# Patient Record
Sex: Male | Born: 1970 | Race: White | Hispanic: No | Marital: Married | Smoking: Never smoker
Health system: Southern US, Community
[De-identification: ages and names within clinical notes are randomized; demographics above are authoritative.]

## PROBLEM LIST (undated history)

## (undated) DIAGNOSIS — I10 Essential (primary) hypertension: Secondary | ICD-10-CM

## (undated) DIAGNOSIS — K219 Gastro-esophageal reflux disease without esophagitis: Secondary | ICD-10-CM

## (undated) DIAGNOSIS — J45909 Unspecified asthma, uncomplicated: Secondary | ICD-10-CM

## (undated) HISTORY — DX: Gastro-esophageal reflux disease without esophagitis: K21.9

## (undated) HISTORY — DX: Unspecified asthma, uncomplicated: J45.909

## (undated) HISTORY — PX: OTHER SURGICAL HISTORY: SHX169

---

## 2004-02-25 HISTORY — PX: KNEE ARTHROSCOPY: SUR90

## 2006-03-11 ENCOUNTER — Ambulatory Visit: Payer: Self-pay | Admitting: Sports Medicine

## 2006-03-24 ENCOUNTER — Ambulatory Visit: Payer: Self-pay | Admitting: Sports Medicine

## 2007-07-07 ENCOUNTER — Encounter: Payer: Self-pay | Admitting: Pulmonary Disease

## 2007-07-07 ENCOUNTER — Ambulatory Visit (HOSPITAL_COMMUNITY): Admission: RE | Admit: 2007-07-07 | Discharge: 2007-07-07 | Payer: Self-pay | Admitting: Internal Medicine

## 2007-08-04 ENCOUNTER — Ambulatory Visit: Payer: Self-pay | Admitting: Pulmonary Disease

## 2007-08-04 DIAGNOSIS — J45909 Unspecified asthma, uncomplicated: Secondary | ICD-10-CM | POA: Insufficient documentation

## 2007-08-04 DIAGNOSIS — I10 Essential (primary) hypertension: Secondary | ICD-10-CM | POA: Insufficient documentation

## 2007-08-04 DIAGNOSIS — J45990 Exercise induced bronchospasm: Secondary | ICD-10-CM | POA: Insufficient documentation

## 2007-08-26 ENCOUNTER — Ambulatory Visit: Payer: Self-pay | Admitting: Pulmonary Disease

## 2007-11-08 ENCOUNTER — Ambulatory Visit: Payer: Self-pay | Admitting: Pulmonary Disease

## 2011-04-04 ENCOUNTER — Ambulatory Visit: Payer: BC Managed Care – PPO | Admitting: Family Medicine

## 2011-04-04 VITALS — BP 129/82 | HR 63 | Temp 97.8°F | Resp 16 | Ht 75.5 in | Wt 215.0 lb

## 2011-04-04 DIAGNOSIS — J0110 Acute frontal sinusitis, unspecified: Secondary | ICD-10-CM

## 2011-04-04 DIAGNOSIS — J019 Acute sinusitis, unspecified: Secondary | ICD-10-CM

## 2011-04-04 DIAGNOSIS — R42 Dizziness and giddiness: Secondary | ICD-10-CM

## 2011-04-04 MED ORDER — MECLIZINE HCL 32 MG PO TABS: 32.0000 mg | ORAL_TABLET | Freq: Three times a day (TID) | ORAL | Status: AC | PRN

## 2011-04-04 MED ORDER — AMOXICILLIN 875 MG PO TABS: 875.0000 mg | ORAL_TABLET | Freq: Two times a day (BID) | ORAL | Status: AC

## 2011-04-04 NOTE — Progress Notes (Signed)
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    O:  TM's normal Oropharynx-clear Chest-clear Neck supple without adenopathy Neuro:  No nystagmus, CN intact, moving 4 extrem normally.  A:  Vertigo, sinusitis  P:  Meclizine and Amoxicillin

## 2011-11-15 ENCOUNTER — Ambulatory Visit: Payer: BC Managed Care – PPO

## 2011-11-15 ENCOUNTER — Emergency Department (INDEPENDENT_AMBULATORY_CARE_PROVIDER_SITE_OTHER)
Admission: EM | Admit: 2011-11-15 | Discharge: 2011-11-15 | Disposition: A | Discharge status: Home / Self Care | Payer: BC Managed Care – PPO | Source: Home / Self Care

## 2011-11-15 DIAGNOSIS — S39012A Strain of muscle, fascia and tendon of lower back, initial encounter: Secondary | ICD-10-CM

## 2011-11-15 DIAGNOSIS — S339XXA Sprain of unspecified parts of lumbar spine and pelvis, initial encounter: Secondary | ICD-10-CM

## 2011-11-15 HISTORY — DX: Essential (primary) hypertension: I10

## 2011-11-15 MED ORDER — KETOROLAC TROMETHAMINE 60 MG/2ML IM SOLN
60.0000 mg | Freq: Once | INTRAMUSCULAR | Status: AC
  Administered 2011-11-15: 60 mg via INTRAMUSCULAR

## 2011-11-15 MED ORDER — CYCLOBENZAPRINE HCL 5 MG PO TABS: 5.0000 mg | ORAL_TABLET | Freq: Three times a day (TID) | ORAL | Status: DC | PRN

## 2011-11-15 MED ORDER — MELOXICAM 15 MG PO TABS: 15.0000 mg | ORAL_TABLET | Freq: Every day | ORAL | Status: DC

## 2011-11-15 NOTE — ED Provider Notes (Signed)
History     CSN: [id number]  Arrival date & time 11/15/11  1722   First MD Initiated Contact with Patient 11/15/11 1759      Chief Complaint  Patient presents with  . Back Pain   HPI Comments: Pt lifted 70-80lb jack earlier today.  2-3 hours later while at home, pt had sudden onset of sever low back pain that nearly dropped pt to knees. Initial pain was in lumbar region that radiated around the back. Pt states that pain felt like electrical shock.  No radiation down leg. No numbness or paresthesias.  Has had severe pain with ambulation since this point.  No bowel or bladder anesthesia    Patient is a 41 y.o. male presenting with back pain.  Back Pain  This is a new problem. Episode onset: earlier today  The problem has not changed since onset.The pain is associated with lifting heavy objects. The pain is present in the lumbar spine. The quality of the pain is described as aching and stabbing.    Past Medical History  Diagnosis Date  . Hypertension     Past Surgical History  Procedure Date  . Knee arthroscopy     History reviewed. No pertinent family history.  History  Substance Use Topics  . Smoking status: Never Smoker   . Smokeless tobacco: Not on file  . Alcohol Use: No      Review of Systems  Musculoskeletal: Positive for back pain.  All other systems reviewed and are negative.    Allergies  Review of patient's allergies indicates no known allergies.  Home Medications   Current Outpatient Rx  Name Route Sig Dispense Refill  . VITAMIN C 1000 MG PO TABS Oral Take 1,000 mg by mouth daily.    Marland Kitchen FINASTERIDE 5 MG PO TABS Oral Take 5 mg by mouth daily.    Marland Kitchen LISINOPRIL-HYDROCHLOROTHIAZIDE 10-12.5 MG PO TABS Oral Take 1 tablet by mouth daily.    Marland Kitchen PSEUDOEPHEDRINE-GUAIFENESIN ER 60-600 MG PO TB12 Oral Take 1 tablet by mouth every 12 (twelve) hours.      BP 115/82  Pulse 54  Temp 98.1 F (36.7 C) (Oral)  Resp 18  Ht 6\' 4"  (1.93 m)  Wt 210 lb (95.255 kg)   BMI 25.56 kg/m2  SpO2 97%  Physical Exam  Constitutional: He appears well-developed and well-nourished.  HENT:  Head: Normocephalic and atraumatic.  Eyes: Conjunctivae normal are normal. Pupils are equal, round, and reactive to light.  Neck: Normal range of motion. Neck supple.  Cardiovascular: Normal rate and regular rhythm.   Pulmonary/Chest: Effort normal and breath sounds normal.  Abdominal: Soft. Bowel sounds are normal.  Musculoskeletal:       Arms:      + TTP in lumbosacral region + marked pain with back flexion and extension + FABER with radiation of pain to L LS region     ED Course  Procedures (including critical care time)  Labs Reviewed - No data to display No results found.   1. Lumbosacral strain       MDM  Likely lumbosacral strain.  No radicular sxs currently.  Will treat with NSAIDs and flexeril.  Toradol 60mg  IM x1.  RICE tx.  Discussed general red flags for reevaluation.  Follow up as needed.      The patient and/or caregiver has been counseled thoroughly with regard to treatment plan and/or medications prescribed including dosage, schedule, interactions, rationale for use, and possible side effects and they verbalize understanding. Diagnoses  and expected course of recovery discussed and will return if not improved as expected or if the condition worsens. Patient and/or caregiver verbalized understanding.             Doree Albee, MD 11/15/11 [id number]

## 2011-11-15 NOTE — ED Notes (Signed)
Lower back pain started today w/out known injury. States he hda lower back pain with a 2-3 second electrical shock that radiated down leg

## 2012-01-07 ENCOUNTER — Encounter: Payer: Self-pay | Admitting: Physician Assistant

## 2012-06-15 ENCOUNTER — Telehealth: Payer: Self-pay

## 2012-06-15 MED ORDER — FINASTERIDE 5 MG PO TABS: 5.0000 mg | ORAL_TABLET | Freq: Every day | ORAL | Status: DC

## 2012-06-15 MED ORDER — LISINOPRIL-HYDROCHLOROTHIAZIDE 10-12.5 MG PO TABS: 1.0000 | ORAL_TABLET | Freq: Every day | ORAL | Status: DC

## 2012-06-15 NOTE — Telephone Encounter (Signed)
What meds does he need?

## 2012-06-15 NOTE — Telephone Encounter (Signed)
PT HAS SCHEDULED COMPLETE PE WITH [facility] FOR MAY 21.  HE NEEDS A REFILL ON HIS TWO SCRIPTS TO LAST HIM UNTIL THEN.  [phone]

## 2012-07-14 ENCOUNTER — Ambulatory Visit (INDEPENDENT_AMBULATORY_CARE_PROVIDER_SITE_OTHER): Payer: BC Managed Care – PPO | Admitting: Internal Medicine

## 2012-07-14 ENCOUNTER — Encounter: Payer: Self-pay | Admitting: Internal Medicine

## 2012-07-14 VITALS — BP 136/89 | HR 50 | Temp 97.0°F | Resp 16 | Ht 75.5 in | Wt 213.0 lb

## 2012-07-14 DIAGNOSIS — L649 Androgenic alopecia, unspecified: Secondary | ICD-10-CM

## 2012-07-14 DIAGNOSIS — Z0000 Encounter for general adult medical examination without abnormal findings: Secondary | ICD-10-CM

## 2012-07-14 DIAGNOSIS — I10 Essential (primary) hypertension: Secondary | ICD-10-CM

## 2012-07-14 LAB — CBC WITH DIFFERENTIAL/PLATELET
Basophils Absolute: 0 10*3/uL (ref 0.0–0.1)
Basophils Relative: 0 % (ref 0–1)
Eosinophils Absolute: 0.1 10*3/uL (ref 0.0–0.7)
Hemoglobin: 14 g/dL (ref 13.0–17.0)
MCH: 27.4 pg (ref 26.0–34.0)
MCHC: 32.8 g/dL (ref 30.0–36.0)
Monocytes Relative: 9 % (ref 3–12)
Neutro Abs: 3.5 10*3/uL (ref 1.7–7.7)
Neutrophils Relative %: 63 % (ref 43–77)
Platelets: 243 10*3/uL (ref 150–400)
RDW: 14 % (ref 11.5–15.5)

## 2012-07-14 LAB — POCT URINALYSIS DIPSTICK
Blood, UA: NEGATIVE
Glucose, UA: NEGATIVE
Leukocytes, UA: NEGATIVE
Nitrite, UA: NEGATIVE
Urobilinogen, UA: 0.2
pH, UA: 7

## 2012-07-14 MED ORDER — FINASTERIDE 5 MG PO TABS: 5.0000 mg | ORAL_TABLET | Freq: Every day | ORAL | Status: DC

## 2012-07-14 MED ORDER — LISINOPRIL 10 MG PO TABS: 10.0000 mg | ORAL_TABLET | Freq: Every day | ORAL | Status: DC

## 2012-07-14 NOTE — Progress Notes (Signed)
  Subjective:    Patient ID: Zachary Diaz, male    DOB: [birth year], 41 y.o.   MRN: [id number]  HPICPE Doing well 5x28oz daily h20--strict attention to hydration has led to resolution of gastroesophageal reflux Home blood pressures are all low Exercise tolerance is great  Current outpatient prescriptions:Ascorbic Acid (VITAMIN C) 1000 MG tablet, Take 1,000 mg by mouth daily., Disp: , Rfl: ;  finasteride (PROSCAR) 5 MG tablet, Take 1 tablet (5 mg total) by mouth daily., Disp: 30 tablet, Rfl: 0;  lisinopril-hydrochlorothiazide (PRINZIDE,ZESTORETIC) 10-12.5 MG per tablet, Take 1 tablet by mouth daily., Disp: 30 tablet, Rfl: 0  Married/blended fam/13yo son high func autism--others younger [employer] [place] Pneumovax 2012 [id number]  Review of Systems  Constitutional: Negative for activity change, appetite change, fatigue and unexpected weight change.  HENT: Negative for congestion, rhinorrhea, trouble swallowing, neck pain and dental problem.   Eyes: Negative for photophobia.  Respiratory: Negative for cough, shortness of breath and wheezing.        History of exercise-induced asthma now stable  Cardiovascular: Negative for chest pain and leg swelling.  Gastrointestinal: Negative for abdominal pain, diarrhea and constipation.  Endocrine: Negative for cold intolerance and heat intolerance.  Genitourinary: Negative for difficulty urinating.  Musculoskeletal: Negative for myalgias, joint swelling, arthralgias and gait problem.       Reason back pain again after a lifting injury resolving with self-directed physical therapy  Skin: Negative for rash.  Neurological: Negative for dizziness and headaches.  Hematological: Negative for adenopathy. Does not bruise/bleed easily.  Psychiatric/Behavioral: Negative for sleep disturbance and dysphoric mood.       Objective:   Physical Exam BP 136/89  Pulse 50  Temp(Src) 97 F (36.1 C)  Resp 16  Ht 6' 3.5" (1.918 m)  Wt 213 lb (96.616 kg)   BMI 26.26 kg/m2 No acute distress Skin clear except early male pattern baldness HEENT clear Heart regular without murmur Lungs clear Abdomen supple without organomegaly Extremities with good pulses and no edema Spine straight Straight leg raise negative Cranial nerves II through XII Romberg negative No sensory and motor losses Mood good/affect appropriate       Assessment & Plan:  Annual physical  Hypertension  Male pattern baldness  History of exercise-induced asthma Will we'll discontinue diuretic portion of blood pressure pill and continue lisinopril 10 mg/he will follow  Meds ordered this encounter  Medications  . lisinopril (PRINIVIL,ZESTRIL) 10 MG tablet    Sig: Take 1 tablet (10 mg total) by mouth daily.    Dispense:  90 tablet    Refill:  3  . finasteride (PROSCAR) 5 MG tablet    Sig: Take 1 tablet (5 mg total) by mouth daily.    Dispense:  90 tablet    Refill:  3    home blood pressures and if above 130/80 consistently we will go up on his medication

## 2012-07-15 LAB — COMPREHENSIVE METABOLIC PANEL
AST: 13 U/L (ref 0–37)
Albumin: 4.3 g/dL (ref 3.5–5.2)
Alkaline Phosphatase: 41 U/L (ref 39–117)
Glucose, Bld: 78 mg/dL (ref 70–99)
Potassium: 4.3 mEq/L (ref 3.5–5.3)
Sodium: 138 mEq/L (ref 135–145)
Total Bilirubin: 0.6 mg/dL (ref 0.3–1.2)
Total Protein: 6.3 g/dL (ref 6.0–8.3)

## 2012-07-15 LAB — LIPID PANEL
HDL: 50 mg/dL (ref >39)
LDL Cholesterol: 104 mg/dL — ABNORMAL HIGH (ref 0–99)
Triglycerides: 51 mg/dL (ref <150)
VLDL: 10 mg/dL (ref 0–40)

## 2012-07-16 ENCOUNTER — Encounter: Payer: Self-pay | Admitting: Internal Medicine

## 2012-11-30 ENCOUNTER — Other Ambulatory Visit: Payer: Self-pay | Admitting: Internal Medicine

## 2012-12-07 ENCOUNTER — Other Ambulatory Visit: Payer: Self-pay | Admitting: Physician Assistant

## 2012-12-15 ENCOUNTER — Encounter: Payer: Self-pay | Admitting: *Deleted

## 2013-07-18 ENCOUNTER — Other Ambulatory Visit: Payer: Self-pay | Admitting: Internal Medicine

## 2013-07-27 ENCOUNTER — Ambulatory Visit (INDEPENDENT_AMBULATORY_CARE_PROVIDER_SITE_OTHER): Payer: BC Managed Care – PPO | Admitting: Internal Medicine

## 2013-07-27 ENCOUNTER — Ambulatory Visit: Payer: BC Managed Care – PPO

## 2013-07-27 ENCOUNTER — Encounter: Payer: Self-pay | Admitting: Internal Medicine

## 2013-07-27 VITALS — BP 117/74 | HR 45 | Temp 98.5°F | Resp 16 | Ht 75.0 in | Wt 214.0 lb

## 2013-07-27 DIAGNOSIS — M545 Low back pain, unspecified: Secondary | ICD-10-CM

## 2013-07-27 DIAGNOSIS — L819 Disorder of pigmentation, unspecified: Secondary | ICD-10-CM

## 2013-07-27 DIAGNOSIS — Z0000 Encounter for general adult medical examination without abnormal findings: Secondary | ICD-10-CM

## 2013-07-27 DIAGNOSIS — L989 Disorder of the skin and subcutaneous tissue, unspecified: Secondary | ICD-10-CM

## 2013-07-27 LAB — LIPID PANEL
Cholesterol: 165 mg/dL (ref 0–200)
HDL: 63 mg/dL (ref >39)
LDL CALC: 94 mg/dL (ref 0–99)
TRIGLYCERIDES: 42 mg/dL (ref <150)
Total CHOL/HDL Ratio: 2.6 Ratio
VLDL: 8 mg/dL (ref 0–40)

## 2013-07-27 LAB — POCT URINALYSIS DIPSTICK
BILIRUBIN UA: NEGATIVE
Blood, UA: NEGATIVE
GLUCOSE UA: NEGATIVE
KETONES UA: NEGATIVE
LEUKOCYTES UA: NEGATIVE
Nitrite, UA: NEGATIVE
PH UA: 7.5
Protein, UA: NEGATIVE
Spec Grav, UA: 1.01
Urobilinogen, UA: 0.2

## 2013-07-27 LAB — CBC WITH DIFFERENTIAL/PLATELET
Basophils Absolute: 0 10*3/uL (ref 0.0–0.1)
Basophils Relative: 0 % (ref 0–1)
Eosinophils Absolute: 0.1 10*3/uL (ref 0.0–0.7)
Eosinophils Relative: 1 % (ref 0–5)
HEMATOCRIT: 42.8 % (ref 39.0–52.0)
HEMOGLOBIN: 14.4 g/dL (ref 13.0–17.0)
LYMPHS ABS: 1.5 10*3/uL (ref 0.7–4.0)
LYMPHS PCT: 25 % (ref 12–46)
MCH: 27.8 pg (ref 26.0–34.0)
MCHC: 33.6 g/dL (ref 30.0–36.0)
MCV: 82.6 fL (ref 78.0–100.0)
MONO ABS: 0.5 10*3/uL (ref 0.1–1.0)
MONOS PCT: 8 % (ref 3–12)
NEUTROS ABS: 4 10*3/uL (ref 1.7–7.7)
NEUTROS PCT: 66 % (ref 43–77)
Platelets: 216 10*3/uL (ref 150–400)
RBC: 5.18 MIL/uL (ref 4.22–5.81)
RDW: 13.6 % (ref 11.5–15.5)
WBC: 6 10*3/uL (ref 4.0–10.5)

## 2013-07-27 LAB — COMPREHENSIVE METABOLIC PANEL
ALBUMIN: 4.5 g/dL (ref 3.5–5.2)
ALT: 10 U/L (ref 0–53)
AST: 14 U/L (ref 0–37)
Alkaline Phosphatase: 37 U/L — ABNORMAL LOW (ref 39–117)
BUN: 12 mg/dL (ref 6–23)
CALCIUM: 9.6 mg/dL (ref 8.4–10.5)
CHLORIDE: 103 meq/L (ref 96–112)
CO2: 29 mEq/L (ref 19–32)
Creat: 1.02 mg/dL (ref 0.50–1.35)
GLUCOSE: 84 mg/dL (ref 70–99)
POTASSIUM: 4.9 meq/L (ref 3.5–5.3)
SODIUM: 139 meq/L (ref 135–145)
TOTAL PROTEIN: 7 g/dL (ref 6.0–8.3)
Total Bilirubin: 0.8 mg/dL (ref 0.2–1.2)

## 2013-07-27 MED ORDER — LISINOPRIL 5 MG PO TABS: 5.0000 mg | ORAL_TABLET | Freq: Every day | ORAL | Status: DC

## 2013-07-27 MED ORDER — FINASTERIDE 5 MG PO TABS: ORAL_TABLET | ORAL | Status: DC

## 2013-07-27 NOTE — Progress Notes (Signed)
   Subjective:    Patient ID: Zachary Diaz, male    DOB: 08/02/1970, 43 y.o.   MRN: [id number]  HPIcpe Patient Active Problem List   Diagnosis Date Noted  . 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 HYPERTENSION 08/04/2007  . EXERCISE INDUCED ASTHMA 08/04/2007  . 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  Skin: Positive for color change.  On face has 2 yr hx irreg splotches during spring and summer rest of ros neg     Objective:   Physical Exam  Constitutional: He is oriented to person, place, and time. He appears well-developed and well-nourished.  HENT:  Head: Normocephalic.  Right Ear: External ear normal.  Left Ear: External ear normal.  Nose: Nose normal.  Mouth/Throat: Oropharynx is clear and moist.  Tms and canals clear  Eyes: Conjunctivae and EOM are normal. Pupils are equal, round, and reactive to light.  Neck: Normal range of motion. Neck supple. No thyromegaly present.  Cardiovascular: Normal rate, regular rhythm, normal heart sounds and intact distal pulses.   No murmur heard. Pulmonary/Chest: Effort normal and breath sounds normal. No respiratory distress. He has no wheezes. He has no rales.  Abdominal: Soft. Bowel sounds are normal. He exhibits no distension and no mass. There is no tenderness. There is no rebound and no guarding.  No hepatosplenomegaly  Musculoskeletal: Normal range of motion. He exhibits no edema and no tenderness.  Can't repro tend w/ palp but can produce pain at l4 region with extension. Post thighs are very tight. Flex to toes =pain as well  Lymphadenopathy:    He has no cervical adenopathy.  Neurological: He is  alert and oriented to person, place, and time. He has normal reflexes. No cranial nerve deficit. He exhibits normal muscle tone. Coordination normal.  Skin: Skin is warm and dry. No rash noted.  Symmetrical facial hyperpigmentation in splotches oddly sparing most shaved areas  Psychiatric: He has a normal mood and affect. His behavior is normal. Judgment and thought content normal.   [facility] reading (PRIMARY) by  Dr. Katherine Basset at L4-5 without spondylo changes      Assessment & Plan:  Routine general medical examination at a health care facility - Plan: POCT urinalysis dipstick, CBC with Differential, Comprehensive metabolic panel, Lipid panel  Lumbar pain - Plan: DG Lumbar Spine Complete, Ambulatory referral to Physical Therapy  Hyperpigmentation of skin of cheek - Plan: Ambulatory referral to Dermatology  Meds ordered this encounter  Medications  . lisinopril (PRINIVIL,ZESTRIL) 5 MG tablet    Sig: Take 1 tablet (5 mg total) by mouth daily.    Dispense:  90 tablet    Refill:  3  . finasteride (PROSCAR) 5 MG tablet    Sig: TAKE 1 TABLET (5 MG TOTAL) BY MOUTH DAILY.    Dispense:  90 tablet    Refill:  3

## 2013-08-01 ENCOUNTER — Encounter: Payer: Self-pay | Admitting: Internal Medicine

## 2013-09-24 ENCOUNTER — Other Ambulatory Visit: Payer: Self-pay | Admitting: Internal Medicine

## 2014-08-02 ENCOUNTER — Encounter: Payer: Self-pay | Admitting: Internal Medicine

## 2014-08-02 ENCOUNTER — Ambulatory Visit (INDEPENDENT_AMBULATORY_CARE_PROVIDER_SITE_OTHER): Payer: BLUE CROSS/BLUE SHIELD | Admitting: Internal Medicine

## 2014-08-02 VITALS — BP 136/90 | HR 38 | Temp 97.9°F | Resp 16 | Ht 76.5 in | Wt 217.6 lb

## 2014-08-02 DIAGNOSIS — Z0000 Encounter for general adult medical examination without abnormal findings: Secondary | ICD-10-CM | POA: Diagnosis not present

## 2014-08-02 DIAGNOSIS — I10 Essential (primary) hypertension: Secondary | ICD-10-CM | POA: Diagnosis not present

## 2014-08-02 LAB — POCT URINALYSIS DIPSTICK
Bilirubin, UA: NEGATIVE
GLUCOSE UA: NEGATIVE
KETONES UA: NEGATIVE
LEUKOCYTES UA: NEGATIVE
NITRITE UA: NEGATIVE
PH UA: 7
Protein, UA: NEGATIVE
RBC UA: NEGATIVE
SPEC GRAV UA: 1.01
UROBILINOGEN UA: 0.2

## 2014-08-02 MED ORDER — FINASTERIDE 5 MG PO TABS: ORAL_TABLET | ORAL | Status: DC

## 2014-08-02 MED ORDER — LISINOPRIL 5 MG PO TABS: 5.0000 mg | ORAL_TABLET | Freq: Every day | ORAL | Status: DC

## 2014-08-02 NOTE — Progress Notes (Signed)
   Subjective:    Patient ID: Zachary Diaz, male    DOB: 05/17/1970, 44 y.o.   MRN: [id number]  HPIannual Patient Active Problem List   Diagnosis Date Noted  . Male pattern baldness--- continues to be satisfied with medication  07/14/2012  . HYPERTENSION 08/04/2007  . 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   HENT: Negative.   Eyes: Negative.   Respiratory: Negative.   Cardiovascular: Negative.   Gastrointestinal: Negative.   Endocrine: Negative.   Genitourinary: Negative.   Musculoskeletal: Negative.   Skin: Negative.   Allergic/Immunologic: Negative.   Neurological: Negative.   Hematological: Negative.   Psychiatric/Behavioral: Negative.        Objective:   Physical Exam  Constitutional: He is oriented to person, place, and time. He appears well-developed and well-nourished.  HENT:  Head: Normocephalic and atraumatic.  Right Ear: Hearing, tympanic membrane, external ear and ear canal normal.  Left Ear: Hearing, tympanic membrane, external ear and ear canal normal.  Nose: Nose normal.  Mouth/Throat: Uvula is midline, oropharynx is clear and moist and mucous membranes are normal.  Eyes: Conjunctivae, EOM and lids are normal. Pupils are equal, round, and reactive to light. Right eye exhibits no discharge. Left eye exhibits no discharge. No scleral icterus.  Neck: Trachea normal and normal range of motion. Neck supple. Carotid bruit is not present.  Cardiovascular: Normal rate, regular rhythm, normal heart sounds, intact distal pulses and normal pulses.   No murmur heard. Pulmonary/Chest: Effort normal and breath sounds normal. No respiratory distress. He has no wheezes. He has no rhonchi. He has no rales.  Abdominal: Soft. Normal appearance and bowel sounds are normal. He exhibits  no abdominal bruit. There is no tenderness.  Musculoskeletal: Normal range of motion. He exhibits no edema or tenderness.  Lymphadenopathy:       Head (right side): No submental, no submandibular, no tonsillar, no preauricular, no posterior auricular and no occipital adenopathy present.       Head (left side): No submental, no submandibular, no tonsillar, no preauricular, no posterior auricular and no occipital adenopathy present.    He has no cervical adenopathy.  Neurological: He is alert and oriented to person, place, and time. He has normal strength and normal reflexes. No cranial nerve deficit or sensory deficit. Coordination and gait normal.  Skin: Skin is warm, dry and intact. No lesion and no rash noted.  Psychiatric: He has a normal mood and affect. His speech is normal and behavior is normal. Judgment and thought content normal.       Assessment & Plan:  Annual physical exam  Essential hypertension - Plan: POCT urinalysis dipstick, Comprehensive metabolic panel, CBC, Lipid panel  Male pattern baldness Meds ordered this encounter  Medications  . lisinopril (PRINIVIL,ZESTRIL) 5 MG tablet    Sig: Take 1 tablet (5 mg total) by mouth daily.    Dispense:  90 tablet    Refill:  3  . finasteride (PROSCAR) 5 MG tablet    Sig: TAKE 1 TABLET (5 MG TOTAL) BY MOUTH DAILY.    Dispense:  90 tablet    Refill:  3   Mail labs

## 2014-08-03 ENCOUNTER — Encounter: Payer: Self-pay | Admitting: Internal Medicine

## 2014-08-03 LAB — CBC
HEMATOCRIT: 44.6 % (ref 39.0–52.0)
Hemoglobin: 14.6 g/dL (ref 13.0–17.0)
MCH: 28 pg (ref 26.0–34.0)
MCHC: 32.7 g/dL (ref 30.0–36.0)
MCV: 85.4 fL (ref 78.0–100.0)
MPV: 10.1 fL (ref 8.6–12.4)
Platelets: 234 10*3/uL (ref 150–400)
RBC: 5.22 MIL/uL (ref 4.22–5.81)
RDW: 13.7 % (ref 11.5–15.5)
WBC: 5.4 10*3/uL (ref 4.0–10.5)

## 2014-08-03 LAB — COMPREHENSIVE METABOLIC PANEL
ALK PHOS: 36 U/L — AB (ref 39–117)
ALT: 14 U/L (ref 0–53)
AST: 16 U/L (ref 0–37)
Albumin: 4.6 g/dL (ref 3.5–5.2)
BILIRUBIN TOTAL: 0.9 mg/dL (ref 0.2–1.2)
BUN: 10 mg/dL (ref 6–23)
CALCIUM: 9.4 mg/dL (ref 8.4–10.5)
CHLORIDE: 101 meq/L (ref 96–112)
CO2: 25 meq/L (ref 19–32)
Creat: 0.86 mg/dL (ref 0.50–1.35)
Glucose, Bld: 72 mg/dL (ref 70–99)
Potassium: 4.8 mEq/L (ref 3.5–5.3)
SODIUM: 138 meq/L (ref 135–145)
Total Protein: 6.6 g/dL (ref 6.0–8.3)

## 2014-08-03 LAB — LIPID PANEL
CHOL/HDL RATIO: 3.3 ratio
Cholesterol: 174 mg/dL (ref 0–200)
HDL: 53 mg/dL (ref >=40)
LDL Cholesterol: 103 mg/dL — ABNORMAL HIGH (ref 0–99)
TRIGLYCERIDES: 89 mg/dL (ref <150)
VLDL: 18 mg/dL (ref 0–40)

## 2015-01-22 ENCOUNTER — Encounter: Payer: Self-pay | Admitting: Internal Medicine

## 2015-08-08 ENCOUNTER — Encounter: Payer: BLUE CROSS/BLUE SHIELD | Admitting: Internal Medicine

## 2015-08-13 ENCOUNTER — Encounter: Payer: BLUE CROSS/BLUE SHIELD | Admitting: Family Medicine

## 2015-08-15 ENCOUNTER — Encounter: Payer: BLUE CROSS/BLUE SHIELD | Admitting: Family Medicine

## 2015-08-16 ENCOUNTER — Encounter: Payer: BLUE CROSS/BLUE SHIELD | Admitting: Family Medicine

## 2015-08-30 ENCOUNTER — Telehealth: Payer: Self-pay | Admitting: *Deleted

## 2015-08-30 ENCOUNTER — Ambulatory Visit (INDEPENDENT_AMBULATORY_CARE_PROVIDER_SITE_OTHER): Payer: BLUE CROSS/BLUE SHIELD | Admitting: Family Medicine

## 2015-08-30 ENCOUNTER — Encounter: Payer: Self-pay | Admitting: Family Medicine

## 2015-08-30 VITALS — BP 124/70 | HR 51 | Temp 98.1°F | Resp 18 | Ht 76.5 in | Wt 219.0 lb

## 2015-08-30 DIAGNOSIS — L649 Androgenic alopecia, unspecified: Secondary | ICD-10-CM | POA: Diagnosis not present

## 2015-08-30 DIAGNOSIS — Z130 Encounter for screening for diseases of the blood and blood-forming organs and certain disorders involving the immune mechanism: Secondary | ICD-10-CM

## 2015-08-30 DIAGNOSIS — Z1329 Encounter for screening for other suspected endocrine disorder: Secondary | ICD-10-CM | POA: Diagnosis not present

## 2015-08-30 DIAGNOSIS — L259 Unspecified contact dermatitis, unspecified cause: Secondary | ICD-10-CM

## 2015-08-30 DIAGNOSIS — Z13220 Encounter for screening for lipoid disorders: Secondary | ICD-10-CM | POA: Diagnosis not present

## 2015-08-30 DIAGNOSIS — I10 Essential (primary) hypertension: Secondary | ICD-10-CM

## 2015-08-30 DIAGNOSIS — Z0000 Encounter for general adult medical examination without abnormal findings: Secondary | ICD-10-CM

## 2015-08-30 DIAGNOSIS — Z114 Encounter for screening for human immunodeficiency virus [HIV]: Secondary | ICD-10-CM

## 2015-08-30 LAB — CBC
HEMATOCRIT: 44.1 % (ref 38.5–50.0)
Hemoglobin: 14.4 g/dL (ref 13.2–17.1)
MCH: 28 pg (ref 27.0–33.0)
MCHC: 32.7 g/dL (ref 32.0–36.0)
MCV: 85.8 fL (ref 80.0–100.0)
MPV: 9.9 fL (ref 7.5–12.5)
PLATELETS: 252 10*3/uL (ref 140–400)
RBC: 5.14 MIL/uL (ref 4.20–5.80)
RDW: 13.7 % (ref 11.0–15.0)
WBC: 5.9 10*3/uL (ref 3.8–10.8)

## 2015-08-30 MED ORDER — LISINOPRIL 5 MG PO TABS: 5.0000 mg | ORAL_TABLET | Freq: Every day | ORAL | Status: DC

## 2015-08-30 MED ORDER — FINASTERIDE 5 MG PO TABS: ORAL_TABLET | ORAL | Status: DC

## 2015-08-30 NOTE — Telephone Encounter (Signed)
[place] to get eye exam results.

## 2015-08-30 NOTE — Progress Notes (Addendum)
By signing my name below, I, Mesha Guinyard, attest that this documentation has been prepared under the direction and in the presence of Merri Ray, MD.  Electronically Signed: Verlee Monte, Medical Scribe. 08/30/2015. 11:30 AM.  Subjective:    Patient ID: Zachary Diaz, male    DOB: [birth year], 45 y.o.   MRN: [id number]  HPI Chief Complaint  Patient presents with  . Annual Exam    HPI Comments: Zachary Diaz is a 45 y.o. male with a PMHx of HTN, asthma, male pattern baldness who presents to the [facility] for his annual exam.  Rash: Pt suspects eczema under his left wrist. Pt has been wearing a knock off fit bit on his left wrist for a while- he recently switched it from his right wrist. Pt states his symptoms have been getting better with hydrocortisone cream. Back Pain: Pt reports tightness in his lower back, hip, and ham strings. Pain is alleviated with stretching and exercises.   Cancer Screening: No FHx of cancer noted. Pt was adopted on his dad side, and he doesn't know about any FHx on his mom's side of the family. Both of his grandparents were dead by the time he was born.  Immunizations: Pt gets his flu shot regularly at [facility]. Immunization History  Administered Date(s) Administered  . Influenza Split 12/23/2011, 12/14/2012, 11/23/2014  . Influenza-Unspecified 11/28/2013  . Pneumococcal Polysaccharide-23 11/25/2010  . Tdap 02/25/2008   Vision: Pt last saw his ophthalmologist Nov 2016. Pt wears contacts. No exam data present  Dentist: Pt saw his dentist last month.  Exercise: Pt exercises 3-4 days a week (less than he's used to) since his wife had a colectomy in Oct with complications. Pt's wife had to stay 90 days in the hospital. Pt has a stand up desk during work.  Depression: Depression screen [facility] 2/9 08/30/2015 08/02/2014 07/27/2013  Decreased Interest 0 0 0  Down, Depressed, Hopeless 0 0 0  PHQ - 2 Score 0 0 0   HTN:  Takes Lisinopril 5 mg QD. Pt denies experiencing any side affects while on Lisinopril- no cough, or SOB. Lab Results  Component Value Date   CREATININE 0.86 08/02/2014   Asthma: Pt has a history of exercise induced asthma.  Male Pattern Baldness: Takes Proscar 5 mg daily. Pt denies having any side affects on this medication.  Patient Active Problem List   Diagnosis Date Noted  . 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 HYPERTENSION 08/04/2007  . EXERCISE INDUCED ASTHMA 08/04/2007  . ASTHMA 08/04/2007   Past Medical History  Diagnosis Date  . Hypertension   . Asthma   . GERD (gastroesophageal reflux disease)    Past Surgical History  Procedure Laterality Date  . Knee arthroscopy     No Known Allergies Prior to Admission medications   Medication Sig Start Date End Date Taking? Authorizing Provider  Ascorbic Acid (VITAMIN C) 1000 MG tablet Take 1,000 mg by mouth daily.   Yes Historical Provider, MD  finasteride (PROSCAR) 5 MG tablet TAKE 1 TABLET (5 MG TOTAL) BY MOUTH DAILY. 08/02/14  Yes Leandrew Koyanagi, MD  lisinopril (PRINIVIL,ZESTRIL) 5 MG tablet Take 1 tablet (5 mg total) by mouth daily. 08/02/14  Yes Leandrew Koyanagi, MD  lisinopril (PRINIVIL,ZESTRIL) 10 MG tablet Take 1 tablet (10 mg total) by mouth daily. Patient not taking: Reported on 08/02/2014    Leandrew Koyanagi, MD   Social History   Social History  . Marital Status: Married  Spouse Name: N/A  . Number of Children: N/A  . Years of Education: N/A   Occupational History  . attorney    Social History Main Topics  . Smoking status: Never Smoker   . Smokeless tobacco: Not on file  . Alcohol Use: 3.0 oz/week    5 Standard drinks or equivalent per week  . Drug Use: No  . Sexual Activity: Yes   Other Topics Concern  . Not on file   Social History Narrative   Married   1 son, autism   1 daughter   Runs   Education: College   Exercise: Yes   Review of Systems  Musculoskeletal: Positive for back pain.    Skin: Positive for rash.  Remainder of 13 point ROS was negative. Objective:  BP 124/70 mmHg  Pulse 51  Temp(Src) 98.1 F (36.7 C) (Oral)  Resp 18  Ht 6' 4.5" (1.943 m)  Wt 219 lb (99.338 kg)  BMI 26.31 kg/m2  SpO2 99%  Physical Exam  Constitutional: He is oriented to person, place, and time. He appears well-developed and well-nourished.  HENT:  Head: Normocephalic and atraumatic.  Right Ear: External ear normal.  Left Ear: External ear normal.  Mouth/Throat: Oropharynx is clear and moist.  Eyes: Conjunctivae and EOM are normal. Pupils are equal, round, and reactive to light.  Neck: Normal range of motion. Neck supple. No thyromegaly present.  Cardiovascular: Normal rate, regular rhythm, normal heart sounds and intact distal pulses.   Pulmonary/Chest: Effort normal and breath sounds normal. No respiratory distress. He has no wheezes.  Abdominal: Soft. He exhibits no distension. There is no tenderness. Hernia confirmed negative in the right inguinal area and confirmed negative in the left inguinal area.  Musculoskeletal: Normal range of motion. He exhibits no edema or tenderness.  Lymphadenopathy:    He has no cervical adenopathy.  Neurological: He is alert and oriented to person, place, and time. He has normal reflexes.  Skin: Skin is warm and dry.  Left wrist: Approximately a 2-3 cm erythema with a faint scale.  Psychiatric: He has a normal mood and affect. His behavior is normal.  Vitals reviewed.  Assessment & Plan:   Zachary Diaz is a 45 y.o. male Annual physical exam  --anticipatory guidance as below in AVS, screening labs above. Health maintenance items as above in HPI discussed/recommended as applicable.   Essential hypertension - Plan: COMPLETE METABOLIC PANEL WITH GFR, Lipid panel, CBC, lisinopril (PRINIVIL,ZESTRIL) 5 MG tablet  -Stable. No med changes. Labs pending.  Male pattern baldness - Plan: finasteride (PROSCAR) 5 MG tablet  -Stable. Tolerating  Proscar, with maintenance of hair growth. No urinary symptoms. Continue same dose.  Screening, iron deficiency anemia - Plan: CBC  Thyroid disorder screen - Plan: TSH  Screening for hyperlipidemia - Plan: COMPLETE METABOLIC PANEL WITH GFR, Lipid panel  Screening for HIV (human immunodeficiency virus) - Plan: HIV antibody  Contact dermatitis  -Likely due to nickel or other metal from his watch band. Discussed applying bandage or clear nail polish over metal to lessen contact irritation. Over-the-counter cortisone if needed. RTC precautions.  Meds ordered this encounter  Medications  . finasteride (PROSCAR) 5 MG tablet    Sig: TAKE 1 TABLET (5 MG TOTAL) BY MOUTH DAILY.    Dispense:  90 tablet    Refill:  3  . lisinopril (PRINIVIL,ZESTRIL) 5 MG tablet    Sig: Take 1 tablet (5 mg total) by mouth daily.    Dispense:  90 tablet  Refill:  3   Patient Instructions       IF you received an x-ray today, you will receive an invoice from [facility]. Please contact [facility] at [phone] with questions or concerns regarding your invoice.   IF you received labwork today, you will receive an invoice from [employer]. Please contact [facility] at [phone] with questions or concerns regarding your invoice.   Our billing staff will not be able to assist you with questions regarding bills from these companies.  You will be contacted with the lab results as soon as they are available. The fastest way to get your results is to activate your My Chart account. Instructions are located on the last page of this paperwork. If you have not heard from [place] regarding the results in 2 weeks, please contact this office.    No change in medications for now. Let me know if you have any worsening of back pain. The rash on your arm appears to be due to a contact dermatitis. See information on this below.  Keeping you healthy  Get these tests  Blood  pressure- Have your blood pressure checked once a year by your healthcare provider.  Normal blood pressure is 120/80.  Weight- Have your body mass index (BMI) calculated to screen for obesity.  BMI is a measure of body fat based on height and weight. You can also calculate your own BMI at GravelBags.it.  Cholesterol- Have your cholesterol checked regularly starting at age 53, sooner may be necessary if you have diabetes, high blood pressure, if a family member developed heart diseases at an early age or if you smoke.   Chlamydia, HIV, and other sexual transmitted disease- Get screened each year until the age of 52 then within three months of each new sexual partner.  Diabetes- Have your blood sugar checked regularly if you have high blood pressure, high cholesterol, a family history of diabetes or if you are overweight.  Get these vaccines  Flu shot- Every fall.  Tetanus shot- Every 10 years.  Menactra- Single dose; prevents meningitis.  Take these steps  Don't smoke- If you do smoke, ask your healthcare provider about quitting. For tips on how to quit, go to www.smokefree.gov or call 1-800-QUIT-NOW.  Be physically active- Exercise 5 days a week for at least 30 minutes.  If you are not already physically active start slow and gradually work up to 30 minutes of moderate physical activity.  Examples of moderate activity include walking briskly, mowing the yard, dancing, swimming bicycling, etc.  Eat a healthy diet- Eat a variety of healthy foods such as fruits, vegetables, low fat milk, low fat cheese, yogurt, lean meats, poultry, fish, beans, tofu, etc.  For more information on healthy eating, go to www.thenutritionsource.org  Drink alcohol in moderation- Limit alcohol intake two drinks or less a day.  Never drink and drive.  Dentist- Brush and floss teeth twice daily; visit your dentis twice a year.  Depression-Your emotional health is as important as your physical health.   If you're feeling down, losing interest in things you normally enjoy please talk with your healthcare provider.  Gun Safety- If you keep a gun in your home, keep it unloaded and with the safety lock on.  Bullets should be stored separately.  Helmet use- Always wear a helmet when riding a motorcycle, bicycle, rollerblading or skateboarding.  Safe sex- If you may be exposed to a sexually transmitted infection, use a condom  Seat belts- Seat  bels can save your life; always wear one.  Smoke/Carbon Monoxide detectors- These detectors need to be installed on the appropriate level of your home.  Replace batteries at least once a year.  Skin Cancer- When out in the sun, cover up and use sunscreen SPF 15 or higher.  Violence- If anyone is threatening or hurting you, please tell your healthcare provider.  Contact Dermatitis Dermatitis is redness, soreness, and swelling (inflammation) of the skin. Contact dermatitis is a reaction to certain substances that touch the skin. There are two types of contact dermatitis:  6. Irritant contact dermatitis. This type is caused by something that irritates your skin, such as dry hands from washing them too much. This type does not require previous exposure to the substance for a reaction to occur. This type is more common. 7. Allergic contact dermatitis. This type is caused by a substance that you are allergic to, such as a nickel allergy or poison ivy. This type only occurs if you have been exposed to the substance (allergen) before. Upon a repeat exposure, your body reacts to the substance. This type is less common. CAUSES  Many different substances can cause contact dermatitis. Irritant contact dermatitis is most commonly caused by exposure to:  4. Makeup.  5. Soaps.  6. Detergents.  7. Bleaches.  8. Acids.  9. Metal salts, such as nickel.  Allergic contact dermatitis is most commonly caused by exposure to:  14. Poisonous plants.  15. Chemicals.   16. Jewelry.  17. Latex.  18. Medicines.  19. Preservatives in products, such as clothing.  RISK FACTORS This condition is more likely to develop in:   People who have jobs that expose them to irritants or allergens.  People who have certain medical conditions, such as asthma or eczema.  SYMPTOMS  Symptoms of this condition may occur anywhere on your body where the irritant has touched you or is touched by you. Symptoms include:  Dryness or flaking.   Redness.   Cracks.   Itching.   Pain or a burning feeling.   Blisters.  Drainage of small amounts of blood or clear fluid from skin cracks. With allergic contact dermatitis, there may also be swelling in areas such as the eyelids, mouth, or genitals.  DIAGNOSIS  This condition is diagnosed with a medical history and physical exam. A patch skin test may be performed to help determine the cause. If the condition is related to your job, you may need to see an occupational medicine specialist. TREATMENT Treatment for this condition includes figuring out what caused the reaction and protecting your skin from further contact. Treatment may also include:   Steroid creams or ointments. Oral steroid medicines may be needed in more severe cases.  Antibiotics or antibacterial ointments, if a skin infection is present.  Antihistamine lotion or an antihistamine taken by mouth to ease itching.  A bandage (dressing). HOME CARE INSTRUCTIONS Skin Care  Moisturize your skin as needed.   Apply cool compresses to the affected areas.  Try taking a bath with:  Epsom salts. Follow the instructions on the packaging. You can get these at your local pharmacy or grocery store.  Baking soda. Pour a small amount into the bath as directed by your health care provider.  Colloidal oatmeal. Follow the instructions on the packaging. You can get this at your local pharmacy or grocery store.  Try applying baking soda paste to your skin.  Stir water into baking soda until it reaches a paste-like consistency.  Do not scratch your skin.  Bathe less frequently, such as every other day.  Bathe in lukewarm water. Avoid using hot water. Medicines  Take or apply over-the-counter and prescription medicines only as told by your health care provider.   If you were prescribed an antibiotic medicine, take or apply your antibiotic as told by your health care provider. Do not stop using the antibiotic even if your condition starts to improve. General Instructions  Keep all follow-up visits as told by your health care provider. This is important.  Avoid the substance that caused your reaction. If you do not know what caused it, keep a journal to try to track what caused it. Write down:  What you eat.  What cosmetic products you use.  What you drink.  What you wear in the affected area. This includes jewelry.  If you were given a dressing, take care of it as told by your health care provider. This includes when to change and remove it. SEEK MEDICAL CARE IF:   Your condition does not improve with treatment.  Your condition gets worse.  You have signs of infection such as swelling, tenderness, redness, soreness, or warmth in the affected area.  You have a fever.  You have new symptoms. SEEK IMMEDIATE MEDICAL CARE IF:   You have a severe headache, neck pain, or neck stiffness.  You vomit.  You feel very sleepy.  You notice red streaks coming from the affected area.  Your bone or joint underneath the affected area becomes painful after the skin has healed.  The affected area turns darker.  You have difficulty breathing.   This information is not intended to replace advice given to you by your health care provider. Make sure you discuss any questions you have with your health care provider.   Document Released: 02/08/2000 Document Revised: 11/01/2014 Document Reviewed: 06/28/2014 Elsevier Interactive Patient  Education [employer].     I personally performed the services described in this documentation, which was scribed in my presence. The recorded information has been reviewed and considered, and addended by me as needed.   Signed,   Merri Ray, MD [facility].  09/01/2015 10:07 AM

## 2015-08-30 NOTE — Patient Instructions (Addendum)
IF you received an x-ray today, you will receive an invoice from [facility]. Please contact [facility] at [phone] with questions or concerns regarding your invoice.   IF you received labwork today, you will receive an invoice from [employer]. Please contact [facility] at [phone] with questions or concerns regarding your invoice.   Our billing staff will not be able to assist you with questions regarding bills from these companies.  You will be contacted with the lab results as soon as they are available. The fastest way to get your results is to activate your My Chart account. Instructions are located on the last page of this paperwork. If you have not heard from [place] regarding the results in 2 weeks, please contact this office.    No change in medications for now. Let me know if you have any worsening of back pain. The rash on your arm appears to be due to a contact dermatitis. See information on this below.  Keeping you healthy  Get these tests  Blood pressure- Have your blood pressure checked once a year by your healthcare provider.  Normal blood pressure is 120/80.  Weight- Have your body mass index (BMI) calculated to screen for obesity.  BMI is a measure of body fat based on height and weight. You can also calculate your own BMI at GravelBags.it.  Cholesterol- Have your cholesterol checked regularly starting at age 57, sooner may be necessary if you have diabetes, high blood pressure, if a family member developed heart diseases at an early age or if you smoke.   Chlamydia, HIV, and other sexual transmitted disease- Get screened each year until the age of 36 then within three months of each new sexual partner.  Diabetes- Have your blood sugar checked regularly if you have high blood pressure, high cholesterol, a family history of diabetes or if you are overweight.  Get these vaccines  Flu shot- Every  fall.  Tetanus shot- Every 10 years.  Menactra- Single dose; prevents meningitis.  Take these steps  Don't smoke- If you do smoke, ask your healthcare provider about quitting. For tips on how to quit, go to www.smokefree.gov or call 1-800-QUIT-NOW.  Be physically active- Exercise 5 days a week for at least 30 minutes.  If you are not already physically active start slow and gradually work up to 30 minutes of moderate physical activity.  Examples of moderate activity include walking briskly, mowing the yard, dancing, swimming bicycling, etc.  Eat a healthy diet- Eat a variety of healthy foods such as fruits, vegetables, low fat milk, low fat cheese, yogurt, lean meats, poultry, fish, beans, tofu, etc.  For more information on healthy eating, go to www.thenutritionsource.org  Drink alcohol in moderation- Limit alcohol intake two drinks or less a day.  Never drink and drive.  Dentist- Brush and floss teeth twice daily; visit your dentis twice a year.  Depression-Your emotional health is as important as your physical health.  If you're feeling down, losing interest in things you normally enjoy please talk with your healthcare provider.  Gun Safety- If you keep a gun in your home, keep it unloaded and with the safety lock on.  Bullets should be stored separately.  Helmet use- Always wear a helmet when riding a motorcycle, bicycle, rollerblading or skateboarding.  Safe sex- If you may be exposed to a sexually transmitted infection, use a condom  Seat belts- Seat bels can save your life; always wear one.  Smoke/Carbon Monoxide  detectors- These detectors need to be installed on the appropriate level of your home.  Replace batteries at least once a year.  Skin Cancer- When out in the sun, cover up and use sunscreen SPF 15 or higher.  Violence- If anyone is threatening or hurting you, please tell your healthcare provider.  Contact Dermatitis Dermatitis is redness, soreness, and swelling  (inflammation) of the skin. Contact dermatitis is a reaction to certain substances that touch the skin. There are two types of contact dermatitis:  6. Irritant contact dermatitis. This type is caused by something that irritates your skin, such as dry hands from washing them too much. This type does not require previous exposure to the substance for a reaction to occur. This type is more common. 7. Allergic contact dermatitis. This type is caused by a substance that you are allergic to, such as a nickel allergy or poison ivy. This type only occurs if you have been exposed to the substance (allergen) before. Upon a repeat exposure, your body reacts to the substance. This type is less common. CAUSES  Many different substances can cause contact dermatitis. Irritant contact dermatitis is most commonly caused by exposure to:  4. Makeup.  5. Soaps.  6. Detergents.  7. Bleaches.  8. Acids.  9. Metal salts, such as nickel.  Allergic contact dermatitis is most commonly caused by exposure to:  14. Poisonous plants.  15. Chemicals.  16. Jewelry.  17. Latex.  18. Medicines.  19. Preservatives in products, such as clothing.  RISK FACTORS This condition is more likely to develop in:   People who have jobs that expose them to irritants or allergens.  People who have certain medical conditions, such as asthma or eczema.  SYMPTOMS  Symptoms of this condition may occur anywhere on your body where the irritant has touched you or is touched by you. Symptoms include:  Dryness or flaking.   Redness.   Cracks.   Itching.   Pain or a burning feeling.   Blisters.  Drainage of small amounts of blood or clear fluid from skin cracks. With allergic contact dermatitis, there may also be swelling in areas such as the eyelids, mouth, or genitals.  DIAGNOSIS  This condition is diagnosed with a medical history and physical exam. A patch skin test may be performed to help determine the cause.  If the condition is related to your job, you may need to see an occupational medicine specialist. TREATMENT Treatment for this condition includes figuring out what caused the reaction and protecting your skin from further contact. Treatment may also include:   Steroid creams or ointments. Oral steroid medicines may be needed in more severe cases.  Antibiotics or antibacterial ointments, if a skin infection is present.  Antihistamine lotion or an antihistamine taken by mouth to ease itching.  A bandage (dressing). HOME CARE INSTRUCTIONS Skin Care  Moisturize your skin as needed.   Apply cool compresses to the affected areas.  Try taking a bath with:  Epsom salts. Follow the instructions on the packaging. You can get these at your local pharmacy or grocery store.  Baking soda. Pour a small amount into the bath as directed by your health care provider.  Colloidal oatmeal. Follow the instructions on the packaging. You can get this at your local pharmacy or grocery store.  Try applying baking soda paste to your skin. Stir water into baking soda until it reaches a paste-like consistency.  Do not scratch your skin.  Bathe less frequently, such as  every other day.  Bathe in lukewarm water. Avoid using hot water. Medicines  Take or apply over-the-counter and prescription medicines only as told by your health care provider.   If you were prescribed an antibiotic medicine, take or apply your antibiotic as told by your health care provider. Do not stop using the antibiotic even if your condition starts to improve. General Instructions  Keep all follow-up visits as told by your health care provider. This is important.  Avoid the substance that caused your reaction. If you do not know what caused it, keep a journal to try to track what caused it. Write down:  What you eat.  What cosmetic products you use.  What you drink.  What you wear in the affected area. This includes  jewelry.  If you were given a dressing, take care of it as told by your health care provider. This includes when to change and remove it. SEEK MEDICAL CARE IF:   Your condition does not improve with treatment.  Your condition gets worse.  You have signs of infection such as swelling, tenderness, redness, soreness, or warmth in the affected area.  You have a fever.  You have new symptoms. SEEK IMMEDIATE MEDICAL CARE IF:   You have a severe headache, neck pain, or neck stiffness.  You vomit.  You feel very sleepy.  You notice red streaks coming from the affected area.  Your bone or joint underneath the affected area becomes painful after the skin has healed.  The affected area turns darker.  You have difficulty breathing.   This information is not intended to replace advice given to you by your health care provider. Make sure you discuss any questions you have with your health care provider.   Document Released: 02/08/2000 Document Revised: 11/01/2014 Document Reviewed: 06/28/2014 Elsevier Interactive Patient Education [employer].

## 2015-08-31 LAB — TSH: TSH: 1.18 m[IU]/L (ref 0.40–4.50)

## 2015-08-31 LAB — COMPLETE METABOLIC PANEL WITH GFR
ALT: 10 U/L (ref 9–46)
AST: 16 U/L (ref 10–40)
Albumin: 4.7 g/dL (ref 3.6–5.1)
Alkaline Phosphatase: 39 U/L — ABNORMAL LOW (ref 40–115)
BUN: 12 mg/dL (ref 7–25)
CO2: 25 mmol/L (ref 20–31)
Calcium: 9.4 mg/dL (ref 8.6–10.3)
Chloride: 103 mmol/L (ref 98–110)
Creat: 0.97 mg/dL (ref 0.60–1.35)
GFR, Est African American: >89 mL/min (ref >=60)
GLUCOSE: 87 mg/dL (ref 65–99)
POTASSIUM: 5 mmol/L (ref 3.5–5.3)
SODIUM: 139 mmol/L (ref 135–146)
Total Bilirubin: 1 mg/dL (ref 0.2–1.2)
Total Protein: 6.9 g/dL (ref 6.1–8.1)

## 2015-08-31 LAB — LIPID PANEL
CHOL/HDL RATIO: 2.8 ratio (ref <=5.0)
Cholesterol: 188 mg/dL (ref 125–200)
HDL: 67 mg/dL (ref >=40)
LDL CALC: 109 mg/dL (ref <130)
Triglycerides: 60 mg/dL (ref <150)
VLDL: 12 mg/dL (ref <30)

## 2015-08-31 LAB — HIV ANTIBODY (ROUTINE TESTING W REFLEX): HIV: NONREACTIVE

## 2016-10-16 ENCOUNTER — Telehealth: Payer: Self-pay

## 2016-10-16 ENCOUNTER — Other Ambulatory Visit: Payer: Self-pay | Admitting: Family Medicine

## 2016-10-16 DIAGNOSIS — I10 Essential (primary) hypertension: Secondary | ICD-10-CM

## 2016-10-16 NOTE — Telephone Encounter (Signed)
Will mail patient a letter letting him know it is time for his CPE this year.

## 2016-10-16 NOTE — Telephone Encounter (Signed)
SS req for Lisinopril Last visit 08/2015 Refilled x 30 days with note. Sent to Schedulers to call pt for CPE

## 2016-11-25 ENCOUNTER — Encounter: Payer: BLUE CROSS/BLUE SHIELD | Admitting: Family Medicine

## 2016-12-01 ENCOUNTER — Ambulatory Visit (INDEPENDENT_AMBULATORY_CARE_PROVIDER_SITE_OTHER): Payer: BLUE CROSS/BLUE SHIELD | Admitting: Family Medicine

## 2016-12-01 ENCOUNTER — Encounter: Payer: Self-pay | Admitting: Family Medicine

## 2016-12-01 VITALS — BP 122/70 | HR 55 | Temp 97.8°F | Resp 16 | Ht 75.0 in | Wt 213.0 lb

## 2016-12-01 DIAGNOSIS — Z0000 Encounter for general adult medical examination without abnormal findings: Secondary | ICD-10-CM

## 2016-12-01 DIAGNOSIS — L649 Androgenic alopecia, unspecified: Secondary | ICD-10-CM | POA: Diagnosis not present

## 2016-12-01 DIAGNOSIS — I10 Essential (primary) hypertension: Secondary | ICD-10-CM

## 2016-12-01 DIAGNOSIS — Z23 Encounter for immunization: Secondary | ICD-10-CM | POA: Diagnosis not present

## 2016-12-01 DIAGNOSIS — Z13220 Encounter for screening for lipoid disorders: Secondary | ICD-10-CM

## 2016-12-01 MED ORDER — FINASTERIDE 5 MG PO TABS: ORAL_TABLET | ORAL | 3 refills | Status: DC

## 2016-12-01 MED ORDER — LISINOPRIL 5 MG PO TABS: 5.0000 mg | ORAL_TABLET | Freq: Every day | ORAL | 3 refills | Status: DC

## 2016-12-01 NOTE — Patient Instructions (Addendum)
No change in meds for now. Thanks for coming in today.   Keeping you healthy  Get these tests  Blood pressure- Have your blood pressure checked once a year by your healthcare provider.  Normal blood pressure is 120/80.  Weight- Have your body mass index (BMI) calculated to screen for obesity.  BMI is a measure of body fat based on height and weight. You can also calculate your own BMI at [employer]velBags.it.  Cholesterol- Have your cholesterol checked regularly starting at age 48, sooner may be necessary if you have diabetes, high blood pressure, if a family member developed heart diseases at an early age or if you smoke.   Chlamydia, HIV, and other sexual transmitted disease- Get screened each year until the age of 51 then within three months of each new sexual partner.  Diabetes- Have your blood sugar checked regularly if you have high blood pressure, high cholesterol, a family history of diabetes or if you are overweight.  Get these vaccines  Flu shot- Every fall.  Tetanus shot- Every 10 years.  Menactra- Single dose; prevents meningitis.  Take these steps  Don't smoke- If you do smoke, ask your healthcare provider about quitting. For tips on how to quit, go to www.smokefree.gov or call 1-800-QUIT-NOW.  Be physically active- Exercise 5 days a week for at least 30 minutes.  If you are not already physically active start slow and gradually work up to 30 minutes of moderate physical activity.  Examples of moderate activity include walking briskly, mowing the yard, dancing, swimming bicycling, etc.  Eat a healthy diet- Eat a variety of healthy foods such as fruits, vegetables, low fat milk, low fat cheese, yogurt, lean meats, poultry, fish, beans, tofu, etc.  For more information on healthy eating, go to www.thenutritionsource.org  Drink alcohol in moderation- Limit alcohol intake two drinks or less a day.  Never drink and drive.  Dentist- Brush and floss teeth twice daily;  visit your dentis twice a year.  Depression-Your emotional health is as important as your physical health.  If you're feeling down, losing interest in things you normally enjoy please talk with your healthcare provider.  Gun Safety- If you keep a gun in your home, keep it unloaded and with the safety lock on.  Bullets should be stored separately.  Helmet use- Always wear a helmet when riding a motorcycle, bicycle, rollerblading or skateboarding.  Safe sex- If you may be exposed to a sexually transmitted infection, use a condom  Seat belts- Seat bels can save your life; always wear one.  Smoke/Carbon Monoxide detectors- These detectors need to be installed on the appropriate level of your home.  Replace batteries at least once a year.  Skin Cancer- When out in the sun, cover up and use sunscreen SPF 15 or higher.  Violence- If anyone is threatening or hurting you, please tell your healthcare provider.  IF you received an x-ray today, you will receive an invoice from [facility]. Please contact [facility] at [phone] with questions or concerns regarding your invoice.   IF you received labwork today, you will receive an invoice from [facility]. Please contact [facility] at [phone] with questions or concerns regarding your invoice.   Our billing staff will not be able to assist you with questions regarding bills from these companies.  You will be contacted with the lab results as soon as they are available. The fastest way to get your results is to activate your My Chart account. Instructions are located on the  last page of this paperwork. If you have not heard from [place] regarding the results in 2 weeks, please contact this office.

## 2016-12-01 NOTE — Progress Notes (Signed)
Subjective:  By signing my name below, I, Moises Blood, attest that this documentation has been prepared under the direction and in the presence of Merri Ray, MD. Electronically Signed: Moises Blood, [place]. 12/01/2016 , 2:01 PM .  Patient was seen in Room 10 .   Patient ID: Zachary Diaz, male    DOB: 1970/07/27, 46 y.o.   MRN: [id number] Chief Complaint  Patient presents with  . Annual Exam   HPI Zachary Diaz is a 46 y.o. male Here for annual physical. He has a history of HTN, asthma, and male pattern baldness.   HTN Lab Results  Component Value Date   CREATININE 0.97 08/30/2015   He takes lisinopril 5mg  QD. He checks his BP at home occasionally, running between 120-130/70-80.   Asthma He has a history of exercise induced asthma. He was doing well last year. He denies any issues with asthma.   Male pattern baldness He takes finasteride 5mg  QD; continued when last discussed during physical last year.   Cancer screening: No family history of cancer noted.   Immunizations Immunization History  Administered Date(s) Administered  . Influenza Split 12/23/2011, 12/14/2012, 11/23/2014  . Influenza-Unspecified 11/28/2013, 11/14/2015  . Pneumococcal Polysaccharide-23 11/25/2010  . Tdap 02/25/2008   Flu shot: updated today  Depression Depression screen [facility] 2/9 12/01/2016 08/30/2015 08/02/2014 07/27/2013  Decreased Interest 0 0 0 0  Down, Depressed, Hopeless 0 0 0 0  PHQ - 2 Score 0 0 0 0    Vision  Visual Acuity Screening   Right eye Left eye Both eyes  Without correction:     With correction: 20/20 20/15 20/13    His annual eye doctor visit will be in Dec. He's had light sensitive eyes ongoing for a long time; no change.   Dentist He sees his dentist regularly.   Exercise He tries to exercise everyday, time varies depending on the day.   Family His children range in ages 39-17; step children ages 28, 69, 97. 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   Patient Active Problem List   Diagnosis Date Noted  . 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 HYPERTENSION 08/04/2007  . EXERCISE INDUCED ASTHMA 08/04/2007  . ASTHMA 08/04/2007   Past Medical History:  Diagnosis Date  . Asthma   . GERD (gastroesophageal reflux disease)   . Hypertension    Past Surgical History:  Procedure Laterality Date  . KNEE ARTHROSCOPY     No Known Allergies Prior to Admission medications   Medication Sig Start Date End Date Taking? Authorizing Provider  Ascorbic Acid (VITAMIN C) 1000 MG tablet Take 1,000 mg by mouth daily.    [provider]  finasteride (PROSCAR) 5 MG tablet TAKE 1 TABLET (5 MG TOTAL) BY MOUTH DAILY. 08/30/15   Wendie Agreste, MD  lisinopril (PRINIVIL,ZESTRIL) 5 MG tablet Take 1 tablet (5 mg total) by mouth daily. **Call office for appt - no further refills-overdue for appt** 10/16/16   Wendie Agreste, MD   Social History   Social History  . Marital status: Married    Spouse name: N/A  . Number of children: N/A  . Years of education: N/A   Occupational History  . attorney    Social History Main Topics  . Smoking status: Never Smoker  . Smokeless tobacco: Never Used  . Alcohol use 3.0 oz/week    5 Standard drinks or equivalent per  week  . Drug use: No  . Sexual activity: Yes   Other Topics Concern  . Not on file   Social History Narrative   Married   1 son, autism   1 daughter   Runs   Education: College   Exercise: Yes   Review of Systems 13 point ROS - positive for light sensitivity; otherwise normal     Objective:   Physical Exam  Constitutional: He is oriented to person, place, and time. He appears well-developed and well-nourished.  HENT:  Head: Normocephalic and atraumatic.  Right Ear: External ear normal.  Left Ear: External ear normal.  Mouth/Throat: Oropharynx is clear and moist.  Eyes:  Pupils are equal, round, and reactive to light. Conjunctivae and EOM are normal.  Neck: Normal range of motion. Neck supple. No thyromegaly present.  Cardiovascular: Normal rate, regular rhythm, normal heart sounds and intact distal pulses.   Pulmonary/Chest: Effort normal and breath sounds normal. No respiratory distress. He has no wheezes.  Abdominal: Soft. He exhibits no distension. There is no tenderness.  Musculoskeletal: Normal range of motion. He exhibits no edema or tenderness.  Lymphadenopathy:    He has no cervical adenopathy.  Neurological: He is alert and oriented to person, place, and time. He has normal reflexes.  Skin: Skin is warm and dry.  Psychiatric: He has a normal mood and affect. His behavior is normal.  Vitals reviewed.  Vitals:   12/01/16 1338  BP: 122/70  Pulse: (!) 55  Resp: 16  Temp: 97.8 F (36.6 C)  TempSrc: Oral  SpO2: 99%  Weight: 213 lb (96.6 kg)  Height: 6\' 3"  (1.905 m)      Assessment & Plan:   Zachary Diaz is a 46 y.o. male Annual physical exam  - -anticipatory guidance as below in AVS, screening labs above. Health maintenance items as above in HPI discussed/recommended as applicable.   Need for prophylactic vaccination and inoculation against influenza - Plan: Flu Vaccine QUAD 36+ mos IM  Male pattern baldness - Plan: finasteride (PROSCAR) 5 MG tablet  - Stable, continue Proscar same dose.  Essential hypertension - Plan: Comprehensive metabolic panel, Lipid panel, lisinopril (PRINIVIL,ZESTRIL) 5 MG tablet  -Stable, including on home readings. Continue lisinopril 5 mg daily  Screening for hyperlipidemia - Plan: Lipid panel   Meds ordered this encounter  Medications  . finasteride (PROSCAR) 5 MG tablet    Sig: TAKE 1 TABLET (5 MG TOTAL) BY MOUTH DAILY.    Dispense:  90 tablet    Refill:  3  . lisinopril (PRINIVIL,ZESTRIL) 5 MG tablet    Sig: Take 1 tablet (5 mg total) by mouth daily.    Dispense:  90 tablet    Refill:  3     Patient Instructions    No change in meds for now. Thanks for coming in today.   Keeping you healthy  Get these tests  Blood pressure- Have your blood pressure checked once a year by your healthcare provider.  Normal blood pressure is 120/80.  Weight- Have your body mass index (BMI) calculated to screen for obesity.  BMI is a measure of body fat based on height and weight. You can also calculate your own BMI at [employer]velBags.it.  Cholesterol- Have your cholesterol checked regularly starting at age 75, sooner may be necessary if you have diabetes, high blood pressure, if a family member developed heart diseases at an early age or if you smoke.   Chlamydia, HIV, and other sexual transmitted disease- Get  screened each year until the age of 37 then within three months of each new sexual partner.  Diabetes- Have your blood sugar checked regularly if you have high blood pressure, high cholesterol, a family history of diabetes or if you are overweight.  Get these vaccines  Flu shot- Every fall.  Tetanus shot- Every 10 years.  Menactra- Single dose; prevents meningitis.  Take these steps  Don't smoke- If you do smoke, ask your healthcare provider about quitting. For tips on how to quit, go to www.smokefree.gov or call 1-800-QUIT-NOW.  Be physically active- Exercise 5 days a week for at least 30 minutes.  If you are not already physically active start slow and gradually work up to 30 minutes of moderate physical activity.  Examples of moderate activity include walking briskly, mowing the yard, dancing, swimming bicycling, etc.  Eat a healthy diet- Eat a variety of healthy foods such as fruits, vegetables, low fat milk, low fat cheese, yogurt, lean meats, poultry, fish, beans, tofu, etc.  For more information on healthy eating, go to www.thenutritionsource.org  Drink alcohol in moderation- Limit alcohol intake two drinks or less a day.  Never drink and drive.  Dentist- Brush  and floss teeth twice daily; visit your dentis twice a year.  Depression-Your emotional health is as important as your physical health.  If you're feeling down, losing interest in things you normally enjoy please talk with your healthcare provider.  Gun Safety- If you keep a gun in your home, keep it unloaded and with the safety lock on.  Bullets should be stored separately.  Helmet use- Always wear a helmet when riding a motorcycle, bicycle, rollerblading or skateboarding.  Safe sex- If you may be exposed to a sexually transmitted infection, use a condom  Seat belts- Seat bels can save your life; always wear one.  Smoke/Carbon Monoxide detectors- These detectors need to be installed on the appropriate level of your home.  Replace batteries at least once a year.  Skin Cancer- When out in the sun, cover up and use sunscreen SPF 15 or higher.  Violence- If anyone is threatening or hurting you, please tell your healthcare provider.  IF you received an x-ray today, you will receive an invoice from [facility]. Please contact [facility] at [phone] with questions or concerns regarding your invoice.   IF you received labwork today, you will receive an invoice from [facility]. Please contact [facility] at [phone] with questions or concerns regarding your invoice.   Our billing staff will not be able to assist you with questions regarding bills from these companies.  You will be contacted with the lab results as soon as they are available. The fastest way to get your results is to activate your My Chart account. Instructions are located on the last page of this paperwork. If you have not heard from [place] regarding the results in 2 weeks, please contact this office.       I personally performed the services described in this documentation, which was scribed in my presence. The recorded information has been reviewed and considered for accuracy and completeness, addended by me  as needed, and agree with information above.  Signed,   Merri Ray, MD [facility] [place].  12/01/16 3:21 PM

## 2016-12-02 LAB — COMPREHENSIVE METABOLIC PANEL
ALBUMIN: 4.6 g/dL (ref 3.5–5.5)
ALK PHOS: 40 IU/L (ref 39–117)
ALT: 12 IU/L (ref 0–44)
AST: 19 IU/L (ref 0–40)
Albumin/Globulin Ratio: 2.3 — ABNORMAL HIGH (ref 1.2–2.2)
BILIRUBIN TOTAL: 0.7 mg/dL (ref 0.0–1.2)
BUN / CREAT RATIO: 13 (ref 9–20)
BUN: 13 mg/dL (ref 6–24)
CHLORIDE: 101 mmol/L (ref 96–106)
CO2: 23 mmol/L (ref 20–29)
Calcium: 9.4 mg/dL (ref 8.7–10.2)
Creatinine, Ser: 1 mg/dL (ref 0.76–1.27)
GFR calc Af Amer: 103 mL/min/{1.73_m2} (ref >59)
GFR calc non Af Amer: 89 mL/min/{1.73_m2} (ref >59)
GLOBULIN, TOTAL: 2 g/dL (ref 1.5–4.5)
Glucose: 81 mg/dL (ref 65–99)
Potassium: 4.5 mmol/L (ref 3.5–5.2)
SODIUM: 141 mmol/L (ref 134–144)
Total Protein: 6.6 g/dL (ref 6.0–8.5)

## 2016-12-02 LAB — LIPID PANEL
CHOLESTEROL TOTAL: 204 mg/dL — AB (ref 100–199)
Chol/HDL Ratio: 3.2 ratio (ref 0.0–5.0)
HDL: 64 mg/dL (ref >39)
LDL CALC: 130 mg/dL — AB (ref 0–99)
Triglycerides: 50 mg/dL (ref 0–149)
VLDL Cholesterol Cal: 10 mg/dL (ref 5–40)

## 2017-10-06 ENCOUNTER — Encounter: Payer: Self-pay | Admitting: Family Medicine

## 2017-10-06 ENCOUNTER — Ambulatory Visit (INDEPENDENT_AMBULATORY_CARE_PROVIDER_SITE_OTHER): Payer: BLUE CROSS/BLUE SHIELD | Admitting: Family Medicine

## 2017-10-06 ENCOUNTER — Other Ambulatory Visit: Payer: Self-pay

## 2017-10-06 VITALS — BP 135/86 | HR 55 | Temp 97.9°F | Ht 76.0 in | Wt 218.6 lb

## 2017-10-06 DIAGNOSIS — G514 Facial myokymia: Secondary | ICD-10-CM | POA: Diagnosis not present

## 2017-10-06 DIAGNOSIS — R253 Fasciculation: Secondary | ICD-10-CM | POA: Diagnosis not present

## 2017-10-06 DIAGNOSIS — R208 Other disturbances of skin sensation: Secondary | ICD-10-CM

## 2017-10-06 NOTE — Progress Notes (Signed)
Subjective:  By signing my name below, I, Zachary Diaz, attest that this documentation has been prepared under the direction and in the presence of Wendie Agreste, MD Electronically Signed: Ladene Artist, ED Scribe 10/06/2017 at 9:33 AM.   Patient ID: Zachary Diaz, male    DOB: [birth year], 47 y.o.   MRN: [id number]  Chief Complaint  Patient presents with  . left facial twitching    stated 3 months ago. On and off with episodes on various spots on the face   HPI Zachary Diaz "Zachary Diaz" is a 47 y.o. male who presents to Primary Care at [facility] complaining of  intermittent L-sdied facial twitching x 3 months. First episode lasted for 36-48 hours with a spasm to the corner of his month for 10-15 seconds occurring during that time. The next twitching episodes returned on 8/2 at the L of the mouth and moved into the base of the L nose, lasted for 4 days. There weren't any spasms at that time. Also had twitching to the L eye yesterday. He has also noticed an odd, pulling sensation from the L cheek to the L side of his mouth. Denies rash, blisters, bumps or cold sores, facial droop, HAs, blurred or double vision, weakness in extremities, tingling in extremities, increased stress, decreased amounts of sleep, increased caffeine consumption. Pt averages ~6 hrs of sleep per night. He typically has 1 cup of coffee and 1 unsweetened tea/day.   Patient Active Problem List   Diagnosis Date Noted  . 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 HYPERTENSION 08/04/2007  . EXERCISE INDUCED ASTHMA 08/04/2007  . ASTHMA 08/04/2007   Past Medical History:  Diagnosis Date  . Asthma   . GERD (gastroesophageal reflux disease)   . Hypertension    Past Surgical History:  Procedure Laterality Date  . KNEE ARTHROSCOPY     No Known Allergies Prior to Admission medications   Medication Sig Start Date End Date Taking? Authorizing Provider  Ascorbic Acid (VITAMIN C) 1000 MG tablet Take 1,000 mg by mouth  daily.    [provider]  finasteride (PROSCAR) 5 MG tablet TAKE 1 TABLET (5 MG TOTAL) BY MOUTH DAILY. 12/01/16   Wendie Agreste, MD  lisinopril (PRINIVIL,ZESTRIL) 5 MG tablet Take 1 tablet (5 mg total) by mouth daily. 12/01/16   Wendie Agreste, MD   Social History   Socioeconomic History  . Marital status: Married    Spouse name: Not on file  . Number of children: Not on file  . Years of education: Not on file  . Highest education level: Not on file  Occupational History  . Occupation: attorney  Social Needs  . Financial resource strain: Not on file  . Food insecurity:    Worry: Not on file    Inability: Not on file  . Transportation needs:    Medical: Not on file    Non-medical: Not on file  Tobacco Use  . Smoking status: Never Smoker  . Smokeless tobacco: Never Used  Substance and Sexual Activity  . Alcohol use: Yes    Alcohol/week: 5.0 standard drinks    Types: 5 Standard drinks or equivalent per week  . Drug use: No  . Sexual activity: Yes  Lifestyle  . Physical activity:    Days per week: Not on file    Minutes per session: Not on file  . Stress: Not on file  Relationships  . Social connections:    Talks on phone: Not on file  Gets together: Not on file    Attends religious service: Not on file    Active member of club or organization: Not on file    Attends meetings of clubs or organizations: Not on file    Relationship status: Not on file  . Intimate partner violence:    Fear of current or ex partner: Not on file    Emotionally abused: Not on file    Physically abused: Not on file    Forced sexual activity: Not on file  Other Topics Concern  . Not on file  Social History Narrative   Married   1 son, autism   1 daughter   Runs   Education: College   Exercise: Yes   Review of Systems  HENT: Negative for mouth sores.   Eyes: Negative for visual disturbance.  Skin: Negative for rash.  Neurological: Negative for facial asymmetry,  weakness and headaches.  Psychiatric/Behavioral: The patient is not nervous/anxious.      Objective:   Physical Exam  Constitutional: He is oriented to person, place, and time. He appears well-developed and well-nourished.  HENT:  Head: Normocephalic and atraumatic.  Right Ear: Tympanic membrane, external ear and ear canal normal.  Left Ear: Tympanic membrane, external ear and ear canal normal.  Nose: No rhinorrhea.  Mouth/Throat: Oropharynx is clear and moist and mucous membranes are normal. No oropharyngeal exudate or posterior oropharyngeal erythema.  Mouth: Equal palate elevation. Normal tongue movement. Equal facial movements. No droop. Equal forehead movements. Mastoids are nontender.  Eyes: Pupils are equal, round, and reactive to light. Conjunctivae and EOM are normal. Right eye exhibits no nystagmus. Left eye exhibits no nystagmus.  Neck: Neck supple. No thyroid mass and no thyromegaly present.  Cardiovascular: Normal rate, regular rhythm, normal heart sounds and intact distal pulses.  No murmur heard. Pulmonary/Chest: Effort normal and breath sounds normal. He has no wheezes. He has no rhonchi. He has no rales.  Abdominal: Soft. There is no tenderness.  Lymphadenopathy:    He has no cervical adenopathy.  Neurological: He is alert and oriented to person, place, and time. He displays a negative Romberg sign.  Gross sensation intact and equal bilaterally on face. Neg tinel at facial nerve. No pronator drift. Normal finger to nose. Normal heel to toe.  Skin: Skin is warm and dry. No rash noted.  Psychiatric: He has a normal mood and affect. His behavior is normal.  Vitals reviewed.  Vitals:   10/06/17 0903  BP: 135/86  Pulse: (!) 55  Temp: 97.9 F (36.6 C)  TempSrc: Oral  SpO2: 97%  Weight: 218 lb 9.6 oz (99.2 kg)  Height: 6\' 4"  (1.93 m)      Assessment & Plan:  Zachary Diaz is a 47 y.o. male Facial twitching - Plan: TSH, Comprehensive metabolic panel,  Ambulatory referral to Neurology  Dysesthesia of face - Plan: TSH, Comprehensive metabolic panel, Ambulatory referral to Neurology  Fasciculations of muscle - Plan: TSH, Comprehensive metabolic panel, Ambulatory referral to Neurology  Intermittent face muscle fasciculations and dysesthesia.  Nonfocal neurologic exam.  Initially we will check some basic electrolytes as well as thyroid testing, but refer to neurology to decide on further testing/evaluation, or possible neuroimaging.  Also recommended try to increase sleep to 7 hours per night and avoid any increased caffeine.  If any vision changes advised to follow-up with his eye care provider, but ER/RTC precautions discussed if acute worsening  No orders of the defined types were placed in this encounter.  Patient Instructions    I will check some electrolytes including thyroid testing, but will also refer you to neurology to discuss these symptoms further and to decide if imaging needed.   Try to increase sleep to 7 hours to see if that helps symptoms, and avoid any increased caffeine for now.   If any vision changes - please discuss with your eye care provider.   Return to the clinic or go to the nearest emergency room if any of your symptoms worsen or new symptoms occur.  Thanks for coming in today.      IF you received an x-ray today, you will receive an invoice from [facility]. Please contact [facility] at [phone] with questions or concerns regarding your invoice.   IF you received labwork today, you will receive an invoice from [facility]. Please contact [facility] at [phone] with questions or concerns regarding your invoice.   Our billing staff will not be able to assist you with questions regarding bills from these companies.  You will be contacted with the lab results as soon as they are available. The fastest way to get your results is to activate your My Chart account. Instructions are located on  the last page of this paperwork. If you have not heard from [place] regarding the results in 2 weeks, please contact this office.       I personally performed the services described in this documentation, which was scribed in my presence. The recorded information has been reviewed and considered for accuracy and completeness, addended by me as needed, and agree with information above.  Signed,   Merri Ray, MD [facility] at [place].  10/11/17 4:22 PM

## 2017-10-06 NOTE — Patient Instructions (Addendum)
  I will check some electrolytes including thyroid testing, but will also refer you to neurology to discuss these symptoms further and to decide if imaging needed.   Try to increase sleep to 7 hours to see if that helps symptoms, and avoid any increased caffeine for now.   If any vision changes - please discuss with your eye care provider.   Return to the clinic or go to the nearest emergency room if any of your symptoms worsen or new symptoms occur.  Thanks for coming in today.      IF you received an x-ray today, you will receive an invoice from [facility]. Please contact [facility] at [phone] with questions or concerns regarding your invoice.   IF you received labwork today, you will receive an invoice from [facility]. Please contact [facility] at [phone] with questions or concerns regarding your invoice.   Our billing staff will not be able to assist you with questions regarding bills from these companies.  You will be contacted with the lab results as soon as they are available. The fastest way to get your results is to activate your My Chart account. Instructions are located on the last page of this paperwork. If you have not heard from [place] regarding the results in 2 weeks, please contact this office.

## 2017-10-07 LAB — TSH: TSH: 1.43 u[IU]/mL (ref 0.450–4.500)

## 2017-10-07 LAB — COMPREHENSIVE METABOLIC PANEL
ALBUMIN: 5 g/dL (ref 3.5–5.5)
ALK PHOS: 49 IU/L (ref 39–117)
ALT: 14 IU/L (ref 0–44)
AST: 14 IU/L (ref 0–40)
Albumin/Globulin Ratio: 2.4 — ABNORMAL HIGH (ref 1.2–2.2)
BILIRUBIN TOTAL: 0.8 mg/dL (ref 0.0–1.2)
BUN / CREAT RATIO: 13 (ref 9–20)
BUN: 13 mg/dL (ref 6–24)
CHLORIDE: 103 mmol/L (ref 96–106)
CO2: 23 mmol/L (ref 20–29)
CREATININE: 1.02 mg/dL (ref 0.76–1.27)
Calcium: 9.5 mg/dL (ref 8.7–10.2)
GFR calc Af Amer: 100 mL/min/{1.73_m2} (ref >59)
GFR calc non Af Amer: 87 mL/min/{1.73_m2} (ref >59)
GLUCOSE: 87 mg/dL (ref 65–99)
Globulin, Total: 2.1 g/dL (ref 1.5–4.5)
Potassium: 4.7 mmol/L (ref 3.5–5.2)
Sodium: 140 mmol/L (ref 134–144)
Total Protein: 7.1 g/dL (ref 6.0–8.5)

## 2017-10-09 ENCOUNTER — Ambulatory Visit: Payer: BLUE CROSS/BLUE SHIELD | Admitting: Diagnostic Neuroimaging

## 2017-10-09 ENCOUNTER — Encounter: Payer: Self-pay | Admitting: Diagnostic Neuroimaging

## 2017-10-09 VITALS — BP 133/76 | HR 57 | Ht 76.0 in | Wt 222.8 lb

## 2017-10-09 DIAGNOSIS — G5139 Clonic hemifacial spasm, unspecified: Secondary | ICD-10-CM | POA: Diagnosis not present

## 2017-10-09 NOTE — Progress Notes (Signed)
[facility]  PATIENT: Zachary Diaz DOB: 12/05/1970  REFERRING CLINICIAN: Jacklyn Shell HISTORY FROM: patient REASON FOR VISIT: new consult    HISTORICAL  CHIEF COMPLAINT:  Chief Complaint  Patient presents with  . Facial twitching, dysesthesia of face    rm 6, New Pt "two episodes of left-sided facial twitching lasting > 1 day, mild dizziness not related, no pain"    HISTORY OF PRESENT ILLNESS:   47 year old male here for evaluation of left facial spasms.  3 months ago patient had 36-hour episode of intermittent left lower facial twitching near the left nasalis muscle.  Symptoms spontaneously resolved.  No triggering factor.  2 to 3 weeks ago patient had recurrence of symptoms, this time lasting for days.  Since that time he is also noticed some slight numbness sensation in his left facial region.  He also notices a mild rocking back and forth sensation and dizziness.  No problems with the right side of the face.  No problems with hands or legs.  No other triggering factors, accidents traumas or injuries.  REVIEW OF SYSTEMS: Full 14 system review of systems performed and negative with exception of: Numbness dizziness blurred vision.  ALLERGIES: No Known Allergies  HOME MEDICATIONS: Outpatient Medications Prior to Visit  Medication Sig Dispense Refill  . finasteride (PROSCAR) 5 MG tablet TAKE 1 TABLET (5 MG TOTAL) BY MOUTH DAILY. 90 tablet 3  . lisinopril (PRINIVIL,ZESTRIL) 5 MG tablet Take 1 tablet (5 mg total) by mouth daily. 90 tablet 3   No facility-administered medications prior to visit.     PAST MEDICAL HISTORY: Past Medical History:  Diagnosis Date  . Asthma   . GERD (gastroesophageal reflux disease)   . Hypertension     PAST SURGICAL HISTORY: Past Surgical History:  Procedure Laterality Date  . KNEE ARTHROSCOPY Left 2006    FAMILY HISTORY: Family History  Problem Relation Age of Onset  . COPD Mother   . Hyperlipidemia Mother     . Hypertension Mother   . Mental retardation Mother   . Mental illness Mother   . Liver disease Sister        heavy drinker  . Mental illness Maternal Grandmother   . Autism Son     SOCIAL HISTORY: Social History   Socioeconomic History  . Marital status: Married    Spouse name: Colletta [place]  . Number of children: 5  . Years of education: doctorate  . Highest education level: Not on file  Occupational History  . 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 Financial resource strain: Not on file  . Food insecurity:    Worry: Not on file    Inability: Not on file  . Transportation needs:    Medical: Not on file    Non-medical: Not on file  Tobacco Use  . Smoking status: Never Smoker  . Smokeless tobacco: Never Used  Substance and Sexual Activity  . Alcohol use: Yes    Alcohol/week: 5.0 standard drinks    Types: 5 Standard drinks or equivalent per week    Comment: 5-7 weekly  . Drug use: No  . Sexual activity: Yes  Lifestyle  . Physical activity:    Days per week: Not on file    Minutes per session: Not on file  . Stress: Not on file  Relationships  . Social connections:    Talks on phone: Not on file    Gets together: Not on file  Attends religious service: Not on file    Active member of club or organization: Not on file    Attends meetings of clubs or organizations: Not on file    Relationship status: Not on file  . Intimate partner violence:    Fear of current or ex partner: Not on file    Emotionally abused: Not on file    Physically abused: Not on file    Forced sexual activity: Not on file  Other Topics Concern  . Not on file  Social History Narrative   Married, 5 children   1 son, autism   1 daughter   Runs   Education: College   Exercise: Yes   Caffeine- 2 daily     PHYSICAL EXAM  GENERAL EXAM/CONSTITUTIONAL: Vitals:  Vitals:   10/09/17 0848  BP: 133/76  Pulse: (!) 57  Weight: 222 lb 12.8 oz (101.1 kg)   Height: 6\' 4"  (1.93 m)     Body mass index is 27.12 kg/m. Wt Readings from Last 3 Encounters:  10/09/17 222 lb 12.8 oz (101.1 kg)  10/06/17 218 lb 9.6 oz (99.2 kg)  12/01/16 213 lb (96.6 kg)     Patient is in no distress; well developed, nourished and groomed; neck is supple  CARDIOVASCULAR:  Examination of carotid arteries is normal; no carotid bruits  Regular rate and rhythm, no murmurs  Examination of peripheral vascular system by observation and palpation is normal  EYES:  Ophthalmoscopic exam of optic discs and posterior segments is normal; no papilledema or hemorrhages  No exam data present  MUSCULOSKELETAL:  Gait, strength, tone, movements noted in Neurologic exam below  NEUROLOGIC: MENTAL STATUS:  No flowsheet data found.  awake, alert, oriented to person, place and time  recent and remote memory intact  normal attention and concentration  language fluent, comprehension intact, naming intact  fund of knowledge appropriate  CRANIAL NERVE:   2nd - no papilledema on fundoscopic exam  2nd, 3rd, 4th, 6th - pupils equal and reactive to light, visual fields full to confrontation, extraocular muscles intact, no nystagmus  5th - facial sensation symmetric  7th - facial strength symmetric  8th - hearing intact  9th - palate elevates symmetrically, uvula midline  11th - shoulder shrug symmetric  12th - tongue protrusion midline  MOTOR:   normal bulk and tone, full strength in the BUE, BLE  SENSORY:   normal and symmetric to light touch, temperature, vibration  COORDINATION:   finger-nose-finger, fine finger movements normal  REFLEXES:   deep tendon reflexes present and symmetric   GAIT/STATION:   narrow based gait; able to tandem; romberg is negative     DIAGNOSTIC DATA (LABS, IMAGING, TESTING) - I reviewed patient records, labs, notes, testing and imaging myself where available.  Lab Results  Component Value Date   WBC 5.9  08/30/2015   HGB 14.4 08/30/2015   HCT 44.1 08/30/2015   MCV 85.8 08/30/2015   PLT 252 08/30/2015      Component Value Date/Time   NA 140 10/06/2017 1152   K 4.7 10/06/2017 1152   CL 103 10/06/2017 1152   CO2 23 10/06/2017 1152   GLUCOSE 87 10/06/2017 1152   GLUCOSE 87 08/30/2015 1200   BUN 13 10/06/2017 1152   CREATININE 1.02 10/06/2017 1152   CREATININE 0.97 08/30/2015 1200   CALCIUM 9.5 10/06/2017 1152   PROT 7.1 10/06/2017 1152   ALBUMIN 5.0 10/06/2017 1152   AST 14 10/06/2017 1152   ALT 14 10/06/2017 1152  ALKPHOS 49 10/06/2017 1152   BILITOT 0.8 10/06/2017 1152   GFRNONAA 87 10/06/2017 1152   GFRNONAA >89 08/30/2015 1200   GFRAA 100 10/06/2017 1152   GFRAA >89 08/30/2015 1200   Lab Results  Component Value Date   CHOL 204 (H) 12/01/2016   HDL 64 12/01/2016   LDLCALC 130 (H) 12/01/2016   TRIG 50 12/01/2016   CHOLHDL 3.2 12/01/2016   No results found for: HGBA1C No results found for: VITAMINB12 Lab Results  Component Value Date   TSH 1.430 10/06/2017       ASSESSMENT AND PLAN  47 y.o. year old male here with new onset left facial muscle spasms, associated with mild left facial numbness and mild dizziness sensation.  Signs and symptoms localized to left CN VII and VIII; also slightly with CN V.  We will proceed with further work-up with MRI of the brain with IAC protocol.  Dx:  1. Hemifacial spasm     PLAN: - MRI brain and IAC (rule out mass, inflamm, demyelination)  Orders Placed This Encounter  Procedures  . MR BRAIN W WO CONTRAST   Return pending test results.    Penni Bombard, MD 06/25/5866, 2:57 AM Certified in Neurology, Neurophysiology and Neuroimaging  [facility] [address] [place], [place] [address] [phone]

## 2017-10-12 ENCOUNTER — Telehealth: Payer: Self-pay | Admitting: Diagnostic Neuroimaging

## 2017-10-12 NOTE — Telephone Encounter (Signed)
MR Brain w/wo contrast Dr. Conley Simmonds Auth: [id number] (exp. 10/12/17 to 11/10/17). Patient is scheduled for 10/21/17 at [facility].

## 2017-10-21 ENCOUNTER — Ambulatory Visit: Payer: BLUE CROSS/BLUE SHIELD

## 2017-10-21 DIAGNOSIS — G5139 Clonic hemifacial spasm, unspecified: Secondary | ICD-10-CM

## 2017-10-21 MED ORDER — GADOBENATE DIMEGLUMINE 529 MG/ML IV SOLN
20.0000 mL | Freq: Once | INTRAVENOUS | Status: AC | PRN
  Administered 2017-10-21: 20 mL via INTRAVENOUS

## 2017-10-27 ENCOUNTER — Encounter: Payer: Self-pay | Admitting: Family Medicine

## 2017-10-28 ENCOUNTER — Telehealth: Payer: Self-pay | Admitting: *Deleted

## 2017-10-28 NOTE — Telephone Encounter (Signed)
His MRI is back as well. Can you result note.

## 2017-10-28 NOTE — Telephone Encounter (Signed)
-----   Message from Penni Bombard, MD sent at 10/28/2017  3:37 PM EDT ----- No major findings. There is a small blood vessel near the left cranial nerve 5 and 7, which may cause symptoms. Can consider neurosurgery consult or trial of medication (gabapentin). Please call patient. I can discuss via phone. -VRP

## 2017-10-28 NOTE — Telephone Encounter (Signed)
Spoke to pt and relayed the MRI results.  Dr. Leta Baptist also speak with him as well.

## 2017-12-18 ENCOUNTER — Encounter: Payer: Self-pay | Admitting: Family Medicine

## 2017-12-28 ENCOUNTER — Encounter: Payer: Self-pay | Admitting: Family Medicine

## 2018-01-04 ENCOUNTER — Other Ambulatory Visit: Payer: Self-pay | Admitting: Family Medicine

## 2018-01-04 DIAGNOSIS — I10 Essential (primary) hypertension: Secondary | ICD-10-CM

## 2018-01-04 NOTE — Telephone Encounter (Signed)
Requested medication (s) are due for refill today: Yes  Requested medication (s) are on the active medication list: Yes  Last refill:  12/01/16  Future visit scheduled: No  Notes to clinic:  Expired Rx     Requested Prescriptions  Pending Prescriptions Disp Refills   lisinopril (PRINIVIL,ZESTRIL) 5 MG tablet [Pharmacy Med Name: LISINOPRIL 5MG  TABLETS] 90 tablet 0    Sig: TAKE 1 TABLET BY MOUTH ONCE DAILY     Cardiovascular:  ACE Inhibitors Passed - 01/04/2018  8:27 AM      Passed - Cr in normal range and within 180 days    Creat  Date Value Ref Range Status  08/30/2015 0.97 0.60 - 1.35 mg/dL Final   Creatinine, Ser  Date Value Ref Range Status  10/06/2017 1.02 0.76 - 1.27 mg/dL Final         Passed - K in normal range and within 180 days    Potassium  Date Value Ref Range Status  10/06/2017 4.7 3.5 - 5.2 mmol/L Final         Passed - Patient is not pregnant      Passed - Last BP in normal range    BP Readings from Last 1 Encounters:  10/09/17 133/76         Passed - Valid encounter within last 6 months    Recent Outpatient Visits          3 months ago Facial twitching   [facility], Ranell Patrick, MD   1 year ago Annual physical exam   [facility], Ranell Patrick, MD   2 years ago Annual physical exam   [facility], Ranell Patrick, MD   3 years ago Annual physical exam   [facility] at [facility], Linton Ham, MD   4 years ago Routine general medical examination at a health care facility   Primary Care at [facility], Linton Ham, MD

## 2018-03-07 ENCOUNTER — Other Ambulatory Visit: Payer: Self-pay | Admitting: Family Medicine

## 2018-03-07 DIAGNOSIS — I10 Essential (primary) hypertension: Secondary | ICD-10-CM

## 2018-03-08 NOTE — Telephone Encounter (Signed)
Courtesy of 90 tabs given. NOV  03/30/2018 Requested Prescriptions  Pending Prescriptions Disp Refills  . lisinopril (PRINIVIL,ZESTRIL) 5 MG tablet [Pharmacy Med Name: LISINOPRIL 5MG  TABLETS] 90 tablet 0    Sig: TAKE 1 TABLET BY MOUTH ONCE DAILY     Cardiovascular:  ACE Inhibitors Passed - 03/07/2018  9:41 AM      Passed - Cr in normal range and within 180 days    Creat  Date Value Ref Range Status  08/30/2015 0.97 0.60 - 1.35 mg/dL Final   Creatinine, Ser  Date Value Ref Range Status  10/06/2017 1.02 0.76 - 1.27 mg/dL Final         Passed - K in normal range and within 180 days    Potassium  Date Value Ref Range Status  10/06/2017 4.7 3.5 - 5.2 mmol/L Final         Passed - Patient is not pregnant      Passed - Last BP in normal range    BP Readings from Last 1 Encounters:  10/09/17 133/76         Passed - Valid encounter within last 6 months    Recent Outpatient Visits          5 months ago Facial twitching   [facility], Ranell Patrick, MD   1 year ago Annual physical exam   [facility], Ranell Patrick, MD   2 years ago Annual physical exam   [facility], Ranell Patrick, MD   3 years ago Annual physical exam   Primary Care at [facility], Linton Ham, MD   4 years ago Routine general medical examination at a health care facility   Primary Care at [facility], Linton Ham, MD      Future Appointments            In 3 weeks Carlota Raspberry Ranell Patrick, MD Primary Care at [place], [facility]

## 2018-03-30 ENCOUNTER — Other Ambulatory Visit: Payer: Self-pay

## 2018-03-30 ENCOUNTER — Ambulatory Visit (INDEPENDENT_AMBULATORY_CARE_PROVIDER_SITE_OTHER): Payer: BLUE CROSS/BLUE SHIELD | Admitting: Family Medicine

## 2018-03-30 VITALS — BP 135/78 | HR 55 | Temp 98.4°F | Resp 14 | Ht 76.0 in | Wt 220.8 lb

## 2018-03-30 DIAGNOSIS — L649 Androgenic alopecia, unspecified: Secondary | ICD-10-CM | POA: Diagnosis not present

## 2018-03-30 DIAGNOSIS — Z0001 Encounter for general adult medical examination with abnormal findings: Secondary | ICD-10-CM | POA: Diagnosis not present

## 2018-03-30 DIAGNOSIS — Z23 Encounter for immunization: Secondary | ICD-10-CM | POA: Diagnosis not present

## 2018-03-30 DIAGNOSIS — Z0000 Encounter for general adult medical examination without abnormal findings: Secondary | ICD-10-CM

## 2018-03-30 DIAGNOSIS — I10 Essential (primary) hypertension: Secondary | ICD-10-CM

## 2018-03-30 DIAGNOSIS — Z125 Encounter for screening for malignant neoplasm of prostate: Secondary | ICD-10-CM

## 2018-03-30 LAB — COMPREHENSIVE METABOLIC PANEL
A/G RATIO: 2 (ref 1.2–2.2)
ALBUMIN: 4.4 g/dL (ref 4.0–5.0)
ALK PHOS: 51 IU/L (ref 39–117)
ALT: 11 IU/L (ref 0–44)
AST: 13 IU/L (ref 0–40)
BILIRUBIN TOTAL: 0.5 mg/dL (ref 0.0–1.2)
BUN / CREAT RATIO: 13 (ref 9–20)
BUN: 14 mg/dL (ref 6–24)
CHLORIDE: 101 mmol/L (ref 96–106)
CO2: 23 mmol/L (ref 20–29)
Calcium: 9.5 mg/dL (ref 8.7–10.2)
Creatinine, Ser: 1.09 mg/dL (ref 0.76–1.27)
GFR calc Af Amer: 92 mL/min/{1.73_m2} (ref >59)
GFR calc non Af Amer: 79 mL/min/{1.73_m2} (ref >59)
GLOBULIN, TOTAL: 2.2 g/dL (ref 1.5–4.5)
Glucose: 84 mg/dL (ref 65–99)
Potassium: 4.4 mmol/L (ref 3.5–5.2)
SODIUM: 139 mmol/L (ref 134–144)
TOTAL PROTEIN: 6.6 g/dL (ref 6.0–8.5)

## 2018-03-30 LAB — LIPID PANEL
CHOLESTEROL TOTAL: 195 mg/dL (ref 100–199)
Chol/HDL Ratio: 3.1 ratio (ref 0.0–5.0)
HDL: 62 mg/dL (ref >39)
LDL Calculated: 123 mg/dL — ABNORMAL HIGH (ref 0–99)
Triglycerides: 51 mg/dL (ref 0–149)
VLDL Cholesterol Cal: 10 mg/dL (ref 5–40)

## 2018-03-30 MED ORDER — LISINOPRIL 5 MG PO TABS: 5.0000 mg | ORAL_TABLET | Freq: Every day | ORAL | 3 refills | Status: DC

## 2018-03-30 MED ORDER — FINASTERIDE 5 MG PO TABS: ORAL_TABLET | ORAL | 1 refills | Status: DC

## 2018-03-30 NOTE — Patient Instructions (Addendum)
Ok to increase to full lisinopril pill to keep BP in 120's/70-80 range.    Thanks for coming in today.   Keeping you healthy  Get these tests  Blood pressure- Have your blood pressure checked once a year by your healthcare provider.  Normal blood pressure is 120/80.  Weight- Have your body mass index (BMI) calculated to screen for obesity.  BMI is a measure of body fat based on height and weight. You can also calculate your own BMI at [employer].  Cholesterol- Have your cholesterol checked regularly starting at age 54, sooner may be necessary if you have diabetes, high blood pressure, if a family member developed heart diseases at an early age or if you smoke.   Chlamydia, HIV, and other sexual transmitted disease- Get screened each year until the age of 29 then within three months of each new sexual partner.  Diabetes- Have your blood sugar checked regularly if you have high blood pressure, high cholesterol, a family history of diabetes or if you are overweight.  Get these vaccines  Flu shot- Every fall.  Tetanus shot- Every 10 years.  Menactra- Single dose; prevents meningitis.  Take these steps  Don't smoke- If you do smoke, ask your healthcare provider about quitting. For tips on how to quit, go to www.smokefree.gov or call 1-800-QUIT-NOW.  Be physically active- Exercise 5 days a week for at least 30 minutes.  If you are not already physically active start slow and gradually work up to 30 minutes of moderate physical activity.  Examples of moderate activity include walking briskly, mowing the yard, dancing, swimming bicycling, etc.  Eat a healthy diet- Eat a variety of healthy foods such as fruits, vegetables, low fat milk, low fat cheese, yogurt, lean meats, poultry, fish, beans, tofu, etc.  For more information on healthy eating, go to www.thenutritionsource.org  Drink alcohol in moderation- Limit alcohol intake two drinks or less a day.  Never drink and  drive.  Dentist- Brush and floss teeth twice daily; visit your dentis twice a year.  Depression-Your emotional health is as important as your physical health.  If you're feeling down, losing interest in things you normally enjoy please talk with your healthcare provider.  Gun Safety- If you keep a gun in your home, keep it unloaded and with the safety lock on.  Bullets should be stored separately.  Helmet use- Always wear a helmet when riding a motorcycle, bicycle, rollerblading or skateboarding.  Safe sex- If you may be exposed to a sexually transmitted infection, use a condom  Seat belts- Seat bels can save your life; always wear one.  Smoke/Carbon Monoxide detectors- These detectors need to be installed on the appropriate level of your home.  Replace batteries at least once a year.  Skin Cancer- When out in the sun, cover up and use sunscreen SPF 15 or higher.  Violence- If anyone is threatening or hurting you, please tell your healthcare provider.  If you have lab work done today you will be contacted with your lab results within the next 2 weeks.  If you have not heard from [place] then please contact us. The fastest way to get your results is to register for My Chart.   IF you received an x-ray today, you will receive an invoice from [facility]. Please contact [facility] at [phone] with questions or concerns regarding your invoice.   IF you received labwork today, you will receive an invoice from [facility]. Please contact [facility] at [phone] with questions or  concerns regarding your invoice.   Our billing staff will not be able to assist you with questions regarding bills from these companies.  You will be contacted with the lab results as soon as they are available. The fastest way to get your results is to activate your My Chart account. Instructions are located on the last page of this paperwork. If you have not heard from [place] regarding the results in 2  weeks, please contact this office.

## 2018-03-30 NOTE — Progress Notes (Signed)
Subjective:    Patient ID: Zachary Diaz, male    DOB: 30-Mar-1970, 48 y.o.   MRN: [id number]  HPI Zachary Diaz is a 48 y.o. male Presents today for: Chief Complaint  Patient presents with  . Annual Exam    not having any issus today, need refill on medication proscar, lisinopril (already Pend)   Prior face symptoms have resolved.   Constipation last fall after time of URI for a few weeks - increased fiber and fluids - improved.  Feeling ok currently.   Hypertension: BP Readings from Last 3 Encounters:  03/30/18 135/78  10/09/17 133/76  10/06/17 135/86   Lab Results  Component Value Date   CREATININE 1.02 10/06/2017  on lisinopril 2.5mg  qd (1/2 of 5mg ) no new side effects.  Home BP: 130-140/70-80.   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 Stable sx's - on for 20 yrs - no new side effects.   Hx of exercise induced asthma - no recent issues. No recent issues, but thought to be GERD. Controlled with GERD control and staying hydrated.   Cancer screening: No known FH of early CA, but FH unknown.  Agrees to PSA testing today, deferring DRE.   Immunization History  Administered Date(s) Administered  . Influenza Split 12/23/2011, 12/14/2012, 11/23/2014  . Influenza,inj,Quad PF,6+ Mos 12/01/2016  . Influenza-Unspecified 11/28/2013, 11/14/2015  . Pneumococcal Polysaccharide-23 11/25/2010  . Tdap 02/25/2008, 03/30/2018  tdap today.  Flu 12/28/17  Depression screen PHQ 2/9 03/30/2018 10/06/2017 12/01/2016 08/30/2015 08/02/2014  Decreased Interest 0 0 0 0 0  Down, Depressed, Hopeless 0 0 0 0 0  PHQ - 2 Score 0 0 0 0 0   No exam data present  Annual visit with optho.   Dental: Every 6 months.   Exercise: Some daily and 6mins continuous 4 times per week.   Patient Active Problem List   Diagnosis Date Noted  . 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 HYPERTENSION 08/04/2007  . EXERCISE INDUCED ASTHMA 08/04/2007  . ASTHMA 08/04/2007   Past  Medical History:  Diagnosis Date  . Asthma   . GERD (gastroesophageal reflux disease)   . Hypertension    Past Surgical History:  Procedure Laterality Date  . KNEE ARTHROSCOPY Left 2006   No Known Allergies Prior to Admission medications   Medication Sig Start Date End Date Taking? Authorizing Provider  finasteride (PROSCAR) 5 MG tablet TAKE 1 TABLET (5 MG TOTAL) BY MOUTH DAILY. 12/01/16  Yes Wendie Agreste, MD  lisinopril (PRINIVIL,ZESTRIL) 5 MG tablet TAKE 1 TABLET BY MOUTH ONCE DAILY 03/08/18  Yes Wendie Agreste, MD  Multiple Vitamin (MULTIVITAMIN) tablet Take 1 tablet by mouth daily.   Yes [provider]   Social History   Socioeconomic History  . Marital status: Married    Spouse name: Colletta [place]  . Number of children: 5  . Years of education: doctorate  . Highest education level: Not on file  Occupational History  . 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 Financial resource strain: Not on file  . Food insecurity:    Worry: Not on file    Inability: Not on file  . Transportation needs:    Medical: Not on file    Non-medical: Not on file  Tobacco Use  . Smoking status: Never Smoker  . Smokeless tobacco: Never Used  Substance and Sexual Activity  . Alcohol use: Yes    Alcohol/week: 5.0 standard  drinks    Types: 5 Standard drinks or equivalent per week    Comment: 5-7 weekly  . Drug use: No  . Sexual activity: Yes  Lifestyle  . Physical activity:    Days per week: Not on file    Minutes per session: Not on file  . Stress: Not on file  Relationships  . Social connections:    Talks on phone: Not on file    Gets together: Not on file    Attends religious service: Not on file    Active member of club or organization: Not on file    Attends meetings of clubs or organizations: Not on file    Relationship status: Not on file  . Intimate partner violence:    Fear of current or ex partner: Not on file    Emotionally  abused: Not on file    Physically abused: Not on file    Forced sexual activity: Not on file  Other Topics Concern  . Not on file  Social History Narrative   Married, 5 children   1 son, autism   1 daughter   Runs   Education: College   Exercise: Yes   Caffeine- 2 daily    Review of Systems 13 point review of systems per patient health survey noted.  Negative other than as indicated above or in HPI.      Objective:   Physical Exam Vitals signs reviewed.  Constitutional:      Appearance: He is well-developed.  HENT:     Head: Normocephalic and atraumatic.     Right Ear: External ear normal.     Left Ear: External ear normal.  Eyes:     Conjunctiva/sclera: Conjunctivae normal.     Pupils: Pupils are equal, round, and reactive to light.  Neck:     Musculoskeletal: Normal range of motion and neck supple.     Thyroid: No thyromegaly.  Cardiovascular:     Rate and Rhythm: Normal rate and regular rhythm.     Heart sounds: Normal heart sounds.  Pulmonary:     Effort: Pulmonary effort is normal. No respiratory distress.     Breath sounds: Normal breath sounds. No wheezing.  Abdominal:     General: There is no distension.     Palpations: Abdomen is soft.     Tenderness: There is no abdominal tenderness.  Musculoskeletal: Normal range of motion.        General: No tenderness.  Lymphadenopathy:     Cervical: No cervical adenopathy.  Skin:    General: Skin is warm and dry.  Neurological:     Mental Status: He is alert and oriented to person, place, and time.     Deep Tendon Reflexes: Reflexes are normal and symmetric.  Psychiatric:        Behavior: Behavior normal.     Vitals:   03/30/18 0844  BP: 135/78  Pulse: (!) 55  Resp: 14  Temp: 98.4 F (36.9 C)  TempSrc: Oral  SpO2: 97%  Weight: 220 lb 12.8 oz (100.2 kg)  Height: 6\' 4"  (1.93 m)         Assessment & Plan:   Zachary Diaz is a 48 y.o. male Annual physical exam - Plan: Comprehensive  metabolic panel, Lipid Panel  - -anticipatory guidance as below in AVS, screening labs above. Health maintenance items as above in HPI discussed/recommended as applicable.   Need for prophylactic vaccination with combined diphtheria-tetanus-pertussis (DTP) vaccine - Plan: Tdap vaccine greater than  or equal to 7yo IM given.   Male pattern baldness - Plan: finasteride (PROSCAR) 5 MG tablet  -  Stable, tolerating current regimen. Medications refilled.   Essential hypertension - Plan: lisinopril (PRINIVIL,ZESTRIL) 5 MG tablet  - borderline elevated - ok to increase to full 5mg  of lisinopril. Labs pending.   Screening for malignant neoplasm of prostate - Plan: PSA  - We discussed pros and cons of prostate cancer screening, and after this discussion, he chose to have screening done. PSA obtained, DRE deferred at present.   Meds ordered this encounter  Medications  . finasteride (PROSCAR) 5 MG tablet    Sig: TAKE 1/4TABLET  BY MOUTH DAILY.    Dispense:  90 tablet    Refill:  1  . lisinopril (PRINIVIL,ZESTRIL) 5 MG tablet    Sig: Take 1 tablet (5 mg total) by mouth daily.    Dispense:  90 tablet    Refill:  3   Patient Instructions   Ok to increase to full lisinopril pill to keep BP in 120's/70-80 range.    Thanks for coming in today.   Keeping you healthy  Get these tests  Blood pressure- Have your blood pressure checked once a year by your healthcare provider.  Normal blood pressure is 120/80.  Weight- Have your body mass index (BMI) calculated to screen for obesity.  BMI is a measure of body fat based on height and weight. You can also calculate your own BMI at [employer].  Cholesterol- Have your cholesterol checked regularly starting at age 21, sooner may be necessary if you have diabetes, high blood pressure, if a family member developed heart diseases at an early age or if you smoke.   Chlamydia, HIV, and other sexual transmitted disease- Get screened each year  until the age of 34 then within three months of each new sexual partner.  Diabetes- Have your blood sugar checked regularly if you have high blood pressure, high cholesterol, a family history of diabetes or if you are overweight.  Get these vaccines  Flu shot- Every fall.  Tetanus shot- Every 10 years.  Menactra- Single dose; prevents meningitis.  Take these steps  Don't smoke- If you do smoke, ask your healthcare provider about quitting. For tips on how to quit, go to www.smokefree.gov or call 1-800-QUIT-NOW.  Be physically active- Exercise 5 days a week for at least 30 minutes.  If you are not already physically active start slow and gradually work up to 30 minutes of moderate physical activity.  Examples of moderate activity include walking briskly, mowing the yard, dancing, swimming bicycling, etc.  Eat a healthy diet- Eat a variety of healthy foods such as fruits, vegetables, low fat milk, low fat cheese, yogurt, lean meats, poultry, fish, beans, tofu, etc.  For more information on healthy eating, go to www.thenutritionsource.org  Drink alcohol in moderation- Limit alcohol intake two drinks or less a day.  Never drink and drive.  Dentist- Brush and floss teeth twice daily; visit your dentis twice a year.  Depression-Your emotional health is as important as your physical health.  If you're feeling down, losing interest in things you normally enjoy please talk with your healthcare provider.  Gun Safety- If you keep a gun in your home, keep it unloaded and with the safety lock on.  Bullets should be stored separately.  Helmet use- Always wear a helmet when riding a motorcycle, bicycle, rollerblading or skateboarding.  Safe sex- If you may be exposed to a sexually transmitted infection,  use a condom  Seat belts- Seat bels can save your life; always wear one.  Smoke/Carbon Monoxide detectors- These detectors need to be installed on the appropriate level of your home.  Replace  batteries at least once a year.  Skin Cancer- When out in the sun, cover up and use sunscreen SPF 15 or higher.  Violence- If anyone is threatening or hurting you, please tell your healthcare provider.  If you have lab work done today you will be contacted with your lab results within the next 2 weeks.  If you have not heard from [place] then please contact us. The fastest way to get your results is to register for My Chart.   IF you received an x-ray today, you will receive an invoice from [facility]. Please contact [facility] at [phone] with questions or concerns regarding your invoice.   IF you received labwork today, you will receive an invoice from [facility]. Please contact [facility] at [phone] with questions or concerns regarding your invoice.   Our billing staff will not be able to assist you with questions regarding bills from these companies.  You will be contacted with the lab results as soon as they are available. The fastest way to get your results is to activate your My Chart account. Instructions are located on the last page of this paperwork. If you have not heard from [place] regarding the results in 2 weeks, please contact this office.       Signed,   Merri Ray, MD [facility].  03/30/18 9:47 AM

## 2018-03-31 LAB — PSA: Prostate Specific Ag, Serum: <0.1 ng/mL (ref 0.0–4.0)

## 2019-02-02 ENCOUNTER — Encounter: Payer: Self-pay | Admitting: Family Medicine

## 2019-02-08 ENCOUNTER — Telehealth: Payer: Self-pay | Admitting: Family Medicine

## 2019-02-08 NOTE — Telephone Encounter (Signed)
Called pt LVM for pt to c/b and flip  his appt for  Tomorrow . We need to flip to virtual or reschedule FR

## 2019-02-09 ENCOUNTER — Encounter: Payer: Self-pay | Admitting: Emergency Medicine

## 2019-02-09 ENCOUNTER — Ambulatory Visit (INDEPENDENT_AMBULATORY_CARE_PROVIDER_SITE_OTHER): Payer: BC Managed Care – PPO

## 2019-02-09 ENCOUNTER — Other Ambulatory Visit: Payer: Self-pay

## 2019-02-09 ENCOUNTER — Ambulatory Visit (INDEPENDENT_AMBULATORY_CARE_PROVIDER_SITE_OTHER): Payer: BC Managed Care – PPO | Admitting: Emergency Medicine

## 2019-02-09 ENCOUNTER — Ambulatory Visit: Payer: BLUE CROSS/BLUE SHIELD | Admitting: Family Medicine

## 2019-02-09 VITALS — BP 133/81 | HR 55 | Temp 97.9°F | Resp 16 | Ht 76.0 in | Wt 217.4 lb

## 2019-02-09 DIAGNOSIS — R1011 Right upper quadrant pain: Secondary | ICD-10-CM

## 2019-02-09 LAB — POCT CBC
Granulocyte percent: 60.4 %G (ref 37–80)
HCT, POC: 44.2 % — AB (ref 29–41)
Hemoglobin: 15 g/dL — AB (ref 11–14.6)
Lymph, poc: 2.2 (ref 0.6–3.4)
MCH, POC: 28.3 pg (ref 27–31.2)
MCHC: 34 g/dL (ref 31.8–35.4)
MCV: 83.3 fL (ref 76–111)
MID (cbc): 0.5 (ref 0–0.9)
MPV: 7.2 fL (ref 0–99.8)
POC Granulocyte: 4 (ref 2–6.9)
POC LYMPH PERCENT: 32.8 %L (ref 10–50)
POC MID %: 6.8 %M (ref 0–12)
Platelet Count, POC: 253 10*3/uL (ref 142–424)
RBC: 5.31 M/uL (ref 4.69–6.13)
RDW, POC: 13.1 %
WBC: 6.7 10*3/uL (ref 4.6–10.2)

## 2019-02-09 LAB — POCT URINALYSIS DIP (MANUAL ENTRY)
Bilirubin, UA: NEGATIVE
Blood, UA: NEGATIVE
Glucose, UA: NEGATIVE mg/dL
Ketones, POC UA: NEGATIVE mg/dL
Leukocytes, UA: NEGATIVE
Nitrite, UA: NEGATIVE
Protein Ur, POC: NEGATIVE mg/dL
Spec Grav, UA: 1.015 (ref 1.010–1.025)
Urobilinogen, UA: 0.2 E.U./dL
pH, UA: 7 (ref 5.0–8.0)

## 2019-02-09 LAB — GLUCOSE, POCT (MANUAL RESULT ENTRY): POC Glucose: 82 mg/dl (ref 70–99)

## 2019-02-09 NOTE — Patient Instructions (Addendum)
     If you have lab work done today you will be contacted with your lab results within the next 2 weeks.  If you have not heard from us then please contact us. The fastest way to get your results is to register for My Chart.   IF you received an x-ray today, you will receive an invoice from [facility]. Please contact [facility] at [phone] with questions or concerns regarding your invoice.   IF you received labwork today, you will receive an invoice from [facility]. Please contact [facility] at [phone] with questions or concerns regarding your invoice.   Our billing staff will not be able to assist you with questions regarding bills from these companies.  You will be contacted with the lab results as soon as they are available. The fastest way to get your results is to activate your My Chart account. Instructions are located on the last page of this paperwork. If you have not heard from us regarding the results in 2 weeks, please contact this office.     Abdominal Pain, Adult  Many things can cause belly (abdominal) pain. Most times, belly pain is not dangerous. Many cases of belly pain can be watched and treated at home. Sometimes belly pain is serious, though. Your doctor will try to find the cause of your belly pain. Follow these instructions at home:  Take over-the-counter and prescription medicines only as told by your doctor. Do not take medicines that help you poop (laxatives) unless told to by your doctor.  Drink enough fluid to keep your pee (urine) clear or pale yellow.  Watch your belly pain for any changes.  Keep all follow-up visits as told by your doctor. This is important. Contact a doctor if:  Your belly pain changes or gets worse.  You are not hungry, or you lose weight without trying.  You are having trouble pooping (constipated) or have watery poop (diarrhea) for more than 2-3 days.  You have pain when you pee or poop.  Your belly  pain wakes you up at night.  Your pain gets worse with meals, after eating, or with certain foods.  You are throwing up and cannot keep anything down.  You have a fever. Get help right away if:  Your pain does not go away as soon as your doctor says it should.  You cannot stop throwing up.  Your pain is only in areas of your belly, such as the right side or the left lower part of the belly.  You have bloody or black poop, or poop that looks like tar.  You have very bad pain, cramping, or bloating in your belly.  You have signs of not having enough fluid or water in your body (dehydration), such as: ? Dark pee, very little pee, or no pee. ? Cracked lips. ? Dry mouth. ? Sunken eyes. ? Sleepiness. ? Weakness. This information is not intended to replace advice given to you by your health care provider. Make sure you discuss any questions you have with your health care provider. Document Released: 07/30/2007 Document Revised: 08/31/2015 Document Reviewed: 07/25/2015 Elsevier Interactive Patient Education  2020 [employer]  

## 2019-02-09 NOTE — Progress Notes (Signed)
Zachary Diaz 48 y.o.   Chief Complaint  Patient presents with  . Abdominal Pain    per pt RIGHT side that radiates down to RIGHT scrotum x 4-6 weeks, denies urinary problems    HISTORY OF PRESENT ILLNESS: This is a 47 y.o. male complaining of intermittent pain to right upper abdomen for the past 4 to 6 weeks.  Describes pain as sharp with some radiation down into right lower abdomen and right scrotal area lasting at times as long as 30 minutes without any other associated symptoms.  Denies urinary symptoms.  Able to eat and drink.  Denies nausea or vomiting.  No changes with eating.  Denies diarrhea.  Denies changes in bowel movements.  Denies blood in the stools or melena.  Denies any other significant symptoms. Has a history of GERD which is under control.  Symptoms not similar to GERD symptoms.  No history of liver or gallbladder issues.  HPI   Prior to Admission medications   Medication Sig Start Date End Date Taking? Authorizing Provider  finasteride (PROSCAR) 5 MG tablet TAKE 1/4TABLET  BY MOUTH DAILY. 03/30/18  Yes Wendie Agreste, MD  lisinopril (PRINIVIL,ZESTRIL) 5 MG tablet Take 1 tablet (5 mg total) by mouth daily. 03/30/18  Yes Wendie Agreste, MD  Multiple Vitamin (MULTIVITAMIN) tablet Take 1 tablet by mouth daily.   Yes [provider]    No Known Allergies  Patient Active Problem List   Diagnosis Date Noted  . 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 HYPERTENSION 08/04/2007  . EXERCISE INDUCED ASTHMA 08/04/2007  . ASTHMA 08/04/2007    Past Medical History:  Diagnosis Date  . Asthma   . GERD (gastroesophageal reflux disease)   . Hypertension     Past Surgical History:  Procedure Laterality Date  . KNEE ARTHROSCOPY Left 2006    Social History   Socioeconomic History  . Marital status: Married    Spouse name: Colletta [place]  . Number of children: 5  . Years of education: doctorate  . Highest education level: Not on file  Occupational History    . 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 Smoking status: Never Smoker  . Smokeless tobacco: Never Used  Substance and Sexual Activity  . Alcohol use: Yes    Alcohol/week: 5.0 standard drinks    Types: 5 Standard drinks or equivalent per week    Comment: 5-7 weekly  . Drug use: No  . Sexual activity: Yes  Other Topics Concern  . Not on file  Social History Narrative   Married, 5 children   1 son, autism   1 daughter   Runs   Education: Secretary/administrator   Exercise: Yes   Caffeine- 2 daily   Social Determinants of Health   Financial Resource Strain:   . Difficulty of Paying Living Expenses: Not on file  Food Insecurity:   . Worried About Charity fundraiser in the Last Year: Not on file  . Ran Out of Food in the Last Year: Not on file  Transportation Needs:   . Lack of Transportation (Medical): Not on file  . Lack of Transportation (Non-Medical): Not on file  Physical Activity:   . Days of Exercise per Week: Not on file  . Minutes of Exercise per Session: Not on file  Stress:   . Feeling of Stress : Not on file  Social Connections:   . Frequency of Communication with Friends and Family: Not on  file  . Frequency of Social Gatherings with Friends and Family: Not on file  . Attends Religious Services: Not on file  . Active Member of Clubs or Organizations: Not on file  . Attends Archivist Meetings: Not on file  . Marital Status: Not on file  Intimate Partner Violence:   . Fear of Current or Ex-Partner: Not on file  . Emotionally Abused: Not on file  . Physically Abused: Not on file  . Sexually Abused: Not on file    Family History  Problem Relation Age of Onset  . COPD Mother   . Hyperlipidemia Mother   . Hypertension Mother   . Mental retardation Mother   . Mental illness Mother   . Liver disease Sister        heavy drinker  . Mental illness Maternal Grandmother   . Autism Son      Review of Systems  Constitutional:  Negative.  Negative for chills and fever.  HENT: Negative.  Negative for congestion and sore throat.   Respiratory: Negative.  Negative for cough and shortness of breath.   Cardiovascular: Negative.  Negative for chest pain and palpitations.  Gastrointestinal: Positive for abdominal pain and heartburn (Occasional). Negative for blood in stool, constipation, diarrhea, melena, nausea and vomiting.  Genitourinary: Negative.  Negative for dysuria, flank pain, frequency, hematuria and urgency.  Musculoskeletal: Negative for myalgias and neck pain.  Skin: Negative.   Neurological: Negative.  Negative for dizziness and headaches.  All other systems reviewed and are negative.  Today's Vitals   02/09/19 1358  BP: 133/81  Pulse: (!) 55  Resp: 16  Temp: 97.9 F (36.6 C)  TempSrc: Oral  SpO2: 99%  Weight: 217 lb 6.4 oz (98.6 kg)  Height: 6\' 4"  (1.93 m)   Body mass index is 26.46 kg/m.   Physical Exam Vitals reviewed.  Constitutional:      Appearance: He is well-developed.  HENT:     Head: Normocephalic.  Eyes:     Extraocular Movements: Extraocular movements intact.     Conjunctiva/sclera: Conjunctivae normal.     Pupils: Pupils are equal, round, and reactive to light.  Cardiovascular:     Rate and Rhythm: Normal rate and regular rhythm.     Pulses: Normal pulses.     Heart sounds: Normal heart sounds.  Pulmonary:     Effort: Pulmonary effort is normal.     Breath sounds: Normal breath sounds.  Abdominal:     General: Bowel sounds are normal. There is no distension.     Palpations: Abdomen is soft. There is no mass.     Tenderness: There is no abdominal tenderness. There is no right CVA tenderness, left CVA tenderness, guarding or rebound.     Hernia: No hernia is present. There is no hernia in the left inguinal area or right inguinal area.  Genitourinary:    Penis: Normal.      Testes: Normal.     Epididymis:     Right: Normal.     Left: Normal.  Lymphadenopathy:      Lower Body: No right inguinal adenopathy. No left inguinal adenopathy.  Skin:    General: Skin is warm and dry.     Capillary Refill: Capillary refill takes less than 2 seconds.  Neurological:     General: No focal deficit present.     Mental Status: He is alert and oriented to person, place, and time.  Psychiatric:        Mood and  Affect: Mood normal.        Behavior: Behavior normal.    Results for orders placed or performed in visit on 02/09/19 (from the past 24 hour(s))  POCT CBC     Status: Abnormal   Collection Time: 02/09/19  2:38 PM  Result Value Ref Range   WBC 6.7 4.6 - 10.2 K/uL   Lymph, poc 2.2 0.6 - 3.4   POC LYMPH PERCENT 32.8 10 - 50 %L   MID (cbc) 0.5 0 - 0.9   POC MID % 6.8 0 - 12 %M   POC Granulocyte 4.0 2 - 6.9   Granulocyte percent 60.4 37 - 80 %G   RBC 5.31 4.69 - 6.13 M/uL   Hemoglobin 15.0 (A) 11 - 14.6 g/dL   HCT, POC 44.2 (A) 29 - 41 %   MCV 83.3 76 - 111 fL   MCH, POC 28.3 27 - 31.2 pg   MCHC 34.0 31.8 - 35.4 g/dL   RDW, POC 13.1 %   Platelet Count, POC 253 142 - 424 K/uL   MPV 7.2 0 - 99.8 fL  POCT glucose (manual entry)     Status: None   Collection Time: 02/09/19  2:39 PM  Result Value Ref Range   POC Glucose 82 70 - 99 mg/dl  POCT urinalysis dipstick     Status: None   Collection Time: 02/09/19  2:40 PM  Result Value Ref Range   Color, UA yellow yellow   Clarity, UA clear clear   Glucose, UA negative negative mg/dL   Bilirubin, UA negative negative   Ketones, POC UA negative negative mg/dL   Spec Grav, UA 1.015 1.010 - 1.025   Blood, UA negative negative   pH, UA 7.0 5.0 - 8.0   Protein Ur, POC negative negative mg/dL   Urobilinogen, UA 0.2 0.2 or 1.0 E.U./dL   Nitrite, UA Negative Negative   Leukocytes, UA Negative Negative   DG Abd Acute W/Chest  Result Date: 02/09/2019 CLINICAL DATA:  Right upper abdominal pain. EXAM: DG ABDOMEN ACUTE W/ 1V CHEST COMPARISON:  None. FINDINGS: The chest is normal. Moderate fecal loading throughout  the length of the colon. No evidence of bowel obstruction phleboliths in the pelvis. The bones and soft tissues are otherwise unremarkable. No free air, portal venous gas, or pneumatosis. IMPRESSION: Moderate fecal loading throughout the colon. No other acute abnormalities are identified. Electronically Signed   By: Dorise Bullion III M.D   On: 02/09/2019 14:58   A total of 40 minutes was spent in the room with the patient, greater than 50% of which was in counseling/coordination of care regarding differential diagnosis, diagnostic approach, review of blood results, urinalysis, and x-rays, diet and nutrition, need for diagnostic ultrasound, medications, prognosis, and need for follow-up with PCP after getting outpatient ultrasound.   ASSESSMENT & PLAN: Zachary Diaz was seen today for abdominal pain.  Diagnoses and all orders for this visit:  Abdominal pain, right upper quadrant -     Comprehensive metabolic panel -     POCT CBC -     POCT glucose (manual entry) -     POCT urinalysis dipstick -     DG Abd Acute W/Chest; Future -     US Abdomen Limited RUQ; Future -     Lipase    Patient Instructions       If you have lab work done today you will be contacted with your lab results within the next 2 weeks.  If you  have not heard from [place] then please contact us. The fastest way to get your results is to register for My Chart.   IF you received an x-ray today, you will receive an invoice from [facility]. Please contact [facility] at [phone] with questions or concerns regarding your invoice.   IF you received labwork today, you will receive an invoice from Center. Please contact [facility] at [phone] with questions or concerns regarding your invoice.   Our billing staff will not be able to assist you with questions regarding bills from these companies.  You will be contacted with the lab results as soon as they are available. The fastest way to get your  results is to activate your My Chart account. Instructions are located on the last page of this paperwork. If you have not heard from [place] regarding the results in 2 weeks, please contact this office.     Abdominal Pain, Adult  Many things can cause belly (abdominal) pain. Most times, belly pain is not dangerous. Many cases of belly pain can be watched and treated at home. Sometimes belly pain is serious, though. Your doctor will try to find the cause of your belly pain. Follow these instructions at home:  Take over-the-counter and prescription medicines only as told by your doctor. Do not take medicines that help you poop (laxatives) unless told to by your doctor.  Drink enough fluid to keep your pee (urine) clear or pale yellow.  Watch your belly pain for any changes.  Keep all follow-up visits as told by your doctor. This is important. Contact a doctor if:  Your belly pain changes or gets worse.  You are not hungry, or you lose weight without trying.  You are having trouble pooping (constipated) or have watery poop (diarrhea) for more than 2-3 days.  You have pain when you pee or poop.  Your belly pain wakes you up at night.  Your pain gets worse with meals, after eating, or with certain foods.  You are throwing up and cannot keep anything down.  You have a fever. Get help right away if:  Your pain does not go away as soon as your doctor says it should.  You cannot stop throwing up.  Your pain is only in areas of your belly, such as the right side or the left lower part of the belly.  You have bloody or black poop, or poop that looks like tar.  You have very bad pain, cramping, or bloating in your belly.  You have signs of not having enough fluid or water in your body (dehydration), such as: ? Dark pee, very little pee, or no pee. ? Cracked lips. ? Dry mouth. ? Sunken eyes. ? Sleepiness. ? Weakness. This information is not intended to replace advice given to you  by your health care provider. Make sure you discuss any questions you have with your health care provider. Document Released: 07/30/2007 Document Revised: 08/31/2015 Document Reviewed: 07/25/2015 Elsevier Interactive Patient Education  2020 [employer]      Agustina Caroli, MD [facility]

## 2019-02-10 LAB — COMPREHENSIVE METABOLIC PANEL
ALT: 13 IU/L (ref 0–44)
AST: 16 IU/L (ref 0–40)
Albumin/Globulin Ratio: 2.6 — ABNORMAL HIGH (ref 1.2–2.2)
Albumin: 4.9 g/dL (ref 4.0–5.0)
Alkaline Phosphatase: 50 IU/L (ref 39–117)
BUN/Creatinine Ratio: 8 — ABNORMAL LOW (ref 9–20)
BUN: 8 mg/dL (ref 6–24)
Bilirubin Total: 0.8 mg/dL (ref 0.0–1.2)
CO2: 24 mmol/L (ref 20–29)
Calcium: 9.6 mg/dL (ref 8.7–10.2)
Chloride: 103 mmol/L (ref 96–106)
Creatinine, Ser: 0.99 mg/dL (ref 0.76–1.27)
GFR calc Af Amer: 103 mL/min/{1.73_m2} (ref >59)
GFR calc non Af Amer: 89 mL/min/{1.73_m2} (ref >59)
Globulin, Total: 1.9 g/dL (ref 1.5–4.5)
Glucose: 81 mg/dL (ref 65–99)
Potassium: 4.5 mmol/L (ref 3.5–5.2)
Sodium: 141 mmol/L (ref 134–144)
Total Protein: 6.8 g/dL (ref 6.0–8.5)

## 2019-02-10 LAB — LIPASE: Lipase: 30 U/L (ref 13–78)

## 2019-02-28 ENCOUNTER — Encounter: Payer: Self-pay | Admitting: Family Medicine

## 2019-03-21 ENCOUNTER — Ambulatory Visit
Admission: RE | Admit: 2019-03-21 | Discharge: 2019-03-21 | Disposition: A | Discharge status: Home / Self Care | Payer: BC Managed Care – PPO | Source: Ambulatory Visit | Attending: Emergency Medicine | Admitting: Emergency Medicine

## 2019-03-21 DIAGNOSIS — R1011 Right upper quadrant pain: Secondary | ICD-10-CM

## 2019-03-22 NOTE — Progress Notes (Signed)
FYI

## 2019-05-19 ENCOUNTER — Ambulatory Visit: Payer: BC Managed Care – PPO | Admitting: Family Medicine

## 2019-06-02 ENCOUNTER — Encounter: Payer: Self-pay | Admitting: Family Medicine

## 2019-06-02 ENCOUNTER — Other Ambulatory Visit: Payer: Self-pay

## 2019-06-02 ENCOUNTER — Ambulatory Visit (INDEPENDENT_AMBULATORY_CARE_PROVIDER_SITE_OTHER): Payer: BC Managed Care – PPO | Admitting: Family Medicine

## 2019-06-02 VITALS — BP 128/82 | HR 55 | Temp 97.9°F | Resp 16 | Ht 75.5 in | Wt 218.6 lb

## 2019-06-02 DIAGNOSIS — R1011 Right upper quadrant pain: Secondary | ICD-10-CM | POA: Diagnosis not present

## 2019-06-02 DIAGNOSIS — Z0001 Encounter for general adult medical examination with abnormal findings: Secondary | ICD-10-CM

## 2019-06-02 DIAGNOSIS — Z0000 Encounter for general adult medical examination without abnormal findings: Secondary | ICD-10-CM

## 2019-06-02 DIAGNOSIS — I10 Essential (primary) hypertension: Secondary | ICD-10-CM

## 2019-06-02 DIAGNOSIS — Z1329 Encounter for screening for other suspected endocrine disorder: Secondary | ICD-10-CM

## 2019-06-02 DIAGNOSIS — L649 Androgenic alopecia, unspecified: Secondary | ICD-10-CM

## 2019-06-02 DIAGNOSIS — Z13220 Encounter for screening for lipoid disorders: Secondary | ICD-10-CM

## 2019-06-02 MED ORDER — FINASTERIDE 5 MG PO TABS: ORAL_TABLET | ORAL | 1 refills | Status: DC

## 2019-06-02 MED ORDER — LISINOPRIL 5 MG PO TABS: 5.0000 mg | ORAL_TABLET | Freq: Every day | ORAL | 3 refills | Status: DC

## 2019-06-02 NOTE — Progress Notes (Signed)
Subjective:  Patient ID: Zachary Diaz, male    DOB: 09/20/1970  Age: 49 y.o. MRN: [id number]  CC:  Chief Complaint  Patient presents with  . Annual Exam    pt wants to follow up with side/abdominal pain from a few months ago, still having it but there was no follow up, no other concerns, needs referal for colonoscopy    HPI Zachary Diaz presents for   Annual physical exam.   Right-sided abdominal pain: Seen by Dr. Mitchel Honour February 09, 2019.  Note reviewed.  Symptoms for 4 to 6 weeks.  Radiating pain down abdomen towards scrotum without other associated symptoms.  CBC without leukocytosis, LFTs normal.  Lipase normal urinalysis normal.  Moderate fecal loading on acute abdomen series but no other acute concerning findings.  Ultrasound January 25: IMPRESSION: 1. No evidence of cholelithiasis. 2. Increased, slightly coarsened echogenicity of the hepatic parenchyma as could be seen in the setting of hepatic steatosis. Correlation with LFTs is advised.   Still having intermittent R sided abdominal symptoms. Sensation daily. Intermittent pain. Uncomfortable at rest. Worse with certain movements. Less frequent radiation to groin - min now. No testicular pain/swelling, no penile d/c.  No n/v/d, no fever. No unexplained night sweats, no weight loss.  Pain about the same - consistent.  No known injury. Stationary bike. No regular contact sports.  No change with straining with BM, no change with cough/sneeze.  Constipation at times, improves with hydration.  No blood in stool.   Work is going well.  Oldest son - 35yo high functioning ASD. Needs   Hypertension: Lisinopril 5 mg daily.  No new side effects.  Home readings: 110-120/70-80.   BP Readings from Last 3 Encounters:  06/02/19 128/82  02/09/19 133/81  03/30/18 135/78   Lab Results  Component Value Date   CREATININE 0.99 02/09/2019   History of male pattern baldness: Finasteride 5 mg 1/4 tablet daily.  Needs refill. No new side effects.   Cancer screening: Unknown biological family hx.  Colon: agrees to GI referral.  Prostate:after R/b of testing - deferred for next year.  Lab Results  Component Value Date   PSA1 <0.1 03/30/2018   Immunization History  Administered Date(s) Administered  . Influenza Split 12/23/2011, 12/14/2012, 11/23/2014  . Influenza,inj,Quad PF,6+ Mos 12/01/2016  . Influenza-Unspecified 11/28/2013, 11/14/2015  . PFIZER SARS-COV-2 Vaccination 05/09/2019, 05/30/2019  . Pneumococcal Polysaccharide-23 11/25/2010  . Tdap 02/25/2008, 03/30/2018    Depression screen [facility] 2/9 06/02/2019 02/09/2019 03/30/2018 10/06/2017 12/01/2016  Decreased Interest 0 0 0 0 0  Down, Depressed, Hopeless 0 0 0 0 0  PHQ - 2 Score 0 0 0 0 0     Hearing Screening   125Hz  250Hz  500Hz  1000Hz  2000Hz  3000Hz  4000Hz  6000Hz  8000Hz   Right ear:           Left ear:             Visual Acuity Screening   Right eye Left eye Both eyes  Without correction:     With correction: 20/20 20/20 20/15   optho yearly.   Dental: Every 6 months.   Exercise.  peloton 4 days per week. At least 116min week.   History Patient Active Problem List   Diagnosis Date Noted  . 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 HYPERTENSION 08/04/2007  . EXERCISE INDUCED ASTHMA 08/04/2007  . ASTHMA 08/04/2007   Past Medical History:  Diagnosis Date  . Asthma   . GERD (gastroesophageal reflux disease)   . Hypertension  Past Surgical History:  Procedure Laterality Date  . KNEE ARTHROSCOPY Left 2006   No Known Allergies Prior to Admission medications   Medication Sig Start Date End Date Taking? Authorizing Provider  finasteride (PROSCAR) 5 MG tablet TAKE 1/4TABLET  BY MOUTH DAILY. 03/30/18  Yes Wendie Agreste, MD  lisinopril (PRINIVIL,ZESTRIL) 5 MG tablet Take 1 tablet (5 mg total) by mouth daily. 03/30/18  Yes Wendie Agreste, MD  Multiple Vitamin (MULTIVITAMIN) tablet Take 1 tablet by mouth daily.   Yes [provider]   Social History   Socioeconomic History  . Marital status: Married    Spouse name: Colletta [place]  . Number of children: 5  . Years of education: doctorate  . Highest education level: Not on file  Occupational History  . 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 Smoking status: Never Smoker  . Smokeless tobacco: Never Used  Substance and Sexual Activity  . Alcohol use: Yes    Alcohol/week: 5.0 standard drinks    Types: 5 Standard drinks or equivalent per week    Comment: 5-7 weekly average  . Drug use: No  . Sexual activity: Yes  Other Topics Concern  . Not on file  Social History Narrative   Married, 5 children   1 son, autism   1 daughter   Runs   Education: Secretary/administrator   Exercise: Yes   Caffeine- 2 daily   Social Determinants of Radio broadcast assistant Strain:   . Difficulty of Paying Living Expenses:   Food Insecurity:   . Worried About Charity fundraiser in the Last Year:   . Arboriculturist in the Last Year:   Transportation Needs:   . Film/video editor (Medical):   Marland Kitchen Lack of Transportation (Non-Medical):   Physical Activity:   . Days of Exercise per Week:   . Minutes of Exercise per Session:   Stress:   . Feeling of Stress :   Social Connections:   . Frequency of Communication with Friends and Family:   . Frequency of Social Gatherings with Friends and Family:   . Attends Religious Services:   . Active Member of Clubs or Organizations:   . Attends Archivist Meetings:   Marland Kitchen Marital Status:   Intimate Partner Violence:   . Fear of Current or Ex-Partner:   . Emotionally Abused:   Marland Kitchen Physically Abused:   . Sexually Abused:     Review of Systems 13 point review of systems per patient health survey noted.  Negative other than as indicated above or in HPI.    Objective:   Vitals:   06/02/19 1419  BP: 128/82  Pulse: (!) 55  Resp: 16  Temp: 97.9 F (36.6 C)  TempSrc: Temporal  SpO2: 99%    Weight: 218 lb 9.6 oz (99.2 kg)  Height: 6' 3.5" (1.918 m)     Physical Exam Vitals reviewed.  Constitutional:      Appearance: He is well-developed.  HENT:     Head: Normocephalic and atraumatic.     Right Ear: External ear normal.     Left Ear: External ear normal.  Eyes:     Conjunctiva/sclera: Conjunctivae normal.     Pupils: Pupils are equal, round, and reactive to light.  Neck:     Thyroid: No thyromegaly.  Cardiovascular:     Rate and Rhythm: Normal rate and regular rhythm.     Heart sounds: Normal  heart sounds.  Pulmonary:     Effort: Pulmonary effort is normal. No respiratory distress.     Breath sounds: Normal breath sounds. No wheezing.  Abdominal:     General: There is no distension.     Palpations: Abdomen is soft.     Tenderness: There is abdominal tenderness (RUQ, just below rib margin. ).     Hernia: There is no hernia in the left inguinal area or right inguinal area.  Genitourinary:    Prostate: Normal.  Musculoskeletal:        General: No tenderness. Normal range of motion.     Cervical back: Normal range of motion and neck supple.  Lymphadenopathy:     Cervical: No cervical adenopathy.  Skin:    General: Skin is warm and dry.  Neurological:     Mental Status: He is alert and oriented to person, place, and time.     Deep Tendon Reflexes: Reflexes are normal and symmetric.  Psychiatric:        Behavior: Behavior normal.        Assessment & Plan:  Keilin Nash Moulton is a 49 y.o. male . Annual physical exam  - -anticipatory guidance as below in AVS, screening labs above. Health maintenance items as above in HPI discussed/recommended as applicable.   RUQ abdominal pain - Plan: Ambulatory referral to Gastroenterology  -Previous ultrasound, lab work in December reassuring.  Differential includes abdominal wall discomfort.  Episodic sensation to brain, possible neuronal impingement.  No sign of hernia, less likely sports hernia without change in  symptoms with increased intra-abdominal pressure/straining.  -Initially will have evaluated by gastroenterology, recheck CMP, then consider trial of PT/PT assessment, plus or minus urology eval if recurrent groin discomfort/sensation.  Male pattern baldness - Plan: finasteride (PROSCAR) 5 MG tablet  -Tolerating finasteride, refilled  Essential hypertension - Plan: lisinopril (ZESTRIL) 5 MG tablet, Comprehensive metabolic panel, TSH  -Stable, continue same regimen, labs pending  Screening for thyroid disorder - Plan: TSH  Screening for hyperlipidemia - Plan: Lipid panel   Meds ordered this encounter  Medications  . finasteride (PROSCAR) 5 MG tablet    Sig: TAKE 1/4TABLET  BY MOUTH DAILY.    Dispense:  90 tablet    Refill:  1  . lisinopril (ZESTRIL) 5 MG tablet    Sig: Take 1 tablet (5 mg total) by mouth daily.    Dispense:  90 tablet    Refill:  3   Patient Instructions   I will refer you to gastroenterology to discuss the right upper quadrant abdominal pain as well as colon cancer screening.  Follow-up with me in 1 month, sooner if the symptoms worsen.  No change in medications at this time.  Keeping you healthy  Get these tests  Blood pressure- Have your blood pressure checked once a year by your healthcare provider.  Normal blood pressure is 120/80  Weight- Have your body mass index (BMI) calculated to screen for obesity.  BMI is a measure of body fat based on height and weight. You can also calculate your own BMI at [email or web address].  Cholesterol- Have your cholesterol checked every year.  Diabetes- Have your blood sugar checked regularly if you have high blood pressure, high cholesterol, have a family history of diabetes or if you are overweight.  Screening for Colon Cancer- Colonoscopy starting at age 62.  Screening may begin sooner depending on your family history and other health conditions. Follow up colonoscopy as directed by your  Gastroenterologist.  Screening  for Prostate Cancer- Both blood work (PSA) and a rectal exam help screen for Prostate Cancer.  Screening begins at age 53 with African-American men and at age 38 with Caucasian men.  Screening may begin sooner depending on your family history.  Take these medicines  Aspirin- One aspirin daily can help prevent Heart disease and Stroke.  Flu shot- Every fall.  Tetanus- Every 10 years.  Zostavax- Once after the age of 31 to prevent Shingles.  Pneumonia shot- Once after the age of 54; if you are younger than 63, ask your healthcare provider if you need a Pneumonia shot.  Take these steps  Don't smoke- If you do smoke, talk to your doctor about quitting.  For tips on how to quit, go to www.smokefree.gov or call 1-800-QUIT-NOW.  Be physically active- Exercise 5 days a week for at least 30 minutes.  If you are not already physically active start slow and gradually work up to 30 minutes of moderate physical activity.  Examples of moderate activity include walking briskly, mowing the yard, dancing, swimming, bicycling, etc.  Eat a healthy diet- Eat a variety of healthy food such as fruits, vegetables, low fat milk, low fat cheese, yogurt, lean meant, poultry, fish, beans, tofu, etc. For more information go to www.thenutritionsource.org  Drink alcohol in moderation- Limit alcohol intake to less than two drinks a day. Never drink and drive.  Dentist- Brush and floss twice daily; visit your dentist twice a year.  Depression- Your emotional health is as important as your physical health. If you're feeling down, or losing interest in things you would normally enjoy please talk to your healthcare provider.  Eye exam- Visit your eye doctor every year.  Safe sex- If you may be exposed to a sexually transmitted infection, use a condom.  Seat belts- Seat belts can save your life; always wear one.  Smoke/Carbon Monoxide detectors- These detectors need to be installed on  the appropriate level of your home.  Replace batteries at least once a year.  Skin cancer- When out in the sun, cover up and use sunscreen 15 SPF or higher.  Violence- If anyone is threatening you, please tell your healthcare provider.  Living Will/ Health care power of attorney- Speak with your healthcare provider and family.    If you have lab work done today you will be contacted with your lab results within the next 2 weeks.  If you have not heard from [place] then please contact us. The fastest way to get your results is to register for My Chart.   IF you received an x-ray today, you will receive an invoice from [facility]. Please contact [facility] at [phone] with questions or concerns regarding your invoice.   IF you received labwork today, you will receive an invoice from [facility]. Please contact [facility] at [phone] with questions or concerns regarding your invoice.   Our billing staff will not be able to assist you with questions regarding bills from these companies.  You will be contacted with the lab results as soon as they are available. The fastest way to get your results is to activate your My Chart account. Instructions are located on the last page of this paperwork. If you have not heard from [place] regarding the results in 2 weeks, please contact this office.         Signed, Merri Ray, MD [facility]

## 2019-06-02 NOTE — Patient Instructions (Addendum)
I will refer you to gastroenterology to discuss the right upper quadrant abdominal pain as well as colon cancer screening.  Follow-up with me in 1 month, sooner if the symptoms worsen.  No change in medications at this time.  Keeping you healthy  Get these tests  Blood pressure- Have your blood pressure checked once a year by your healthcare provider.  Normal blood pressure is 120/80  Weight- Have your body mass index (BMI) calculated to screen for obesity.  BMI is a measure of body fat based on height and weight. You can also calculate your own BMI at [email or web address].  Cholesterol- Have your cholesterol checked every year.  Diabetes- Have your blood sugar checked regularly if you have high blood pressure, high cholesterol, have a family history of diabetes or if you are overweight.  Screening for Colon Cancer- Colonoscopy starting at age 48.  Screening may begin sooner depending on your family history and other health conditions. Follow up colonoscopy as directed by your Gastroenterologist.  Screening for Prostate Cancer- Both blood work (PSA) and a rectal exam help screen for Prostate Cancer.  Screening begins at age 58 with African-American men and at age 62 with Caucasian men.  Screening may begin sooner depending on your family history.  Take these medicines  Aspirin- One aspirin daily can help prevent Heart disease and Stroke.  Flu shot- Every fall.  Tetanus- Every 10 years.  Zostavax- Once after the age of 60 to prevent Shingles.  Pneumonia shot- Once after the age of 3; if you are younger than 91, ask your healthcare provider if you need a Pneumonia shot.  Take these steps  Don't smoke- If you do smoke, talk to your doctor about quitting.  For tips on how to quit, go to www.smokefree.gov or call 1-800-QUIT-NOW.  Be physically active- Exercise 5 days a week for at least 30 minutes.  If you are not already physically active start slow and gradually work up to 30  minutes of moderate physical activity.  Examples of moderate activity include walking briskly, mowing the yard, dancing, swimming, bicycling, etc.  Eat a healthy diet- Eat a variety of healthy food such as fruits, vegetables, low fat milk, low fat cheese, yogurt, lean meant, poultry, fish, beans, tofu, etc. For more information go to www.thenutritionsource.org  Drink alcohol in moderation- Limit alcohol intake to less than two drinks a day. Never drink and drive.  Dentist- Brush and floss twice daily; visit your dentist twice a year.  Depression- Your emotional health is as important as your physical health. If you're feeling down, or losing interest in things you would normally enjoy please talk to your healthcare provider.  Eye exam- Visit your eye doctor every year.  Safe sex- If you may be exposed to a sexually transmitted infection, use a condom.  Seat belts- Seat belts can save your life; always wear one.  Smoke/Carbon Monoxide detectors- These detectors need to be installed on the appropriate level of your home.  Replace batteries at least once a year.  Skin cancer- When out in the sun, cover up and use sunscreen 15 SPF or higher.  Violence- If anyone is threatening you, please tell your healthcare provider.  Living Will/ Health care power of attorney- Speak with your healthcare provider and family.    If you have lab work done today you will be contacted with your lab results within the next 2 weeks.  If you have not heard from [place] then please contact us. The fastest way  to get your results is to register for My Chart.   IF you received an x-ray today, you will receive an invoice from [facility]. Please contact [facility] at [phone] with questions or concerns regarding your invoice.   IF you received labwork today, you will receive an invoice from [facility]. Please contact [facility] at [phone] with questions or concerns regarding your invoice.   Our  billing staff will not be able to assist you with questions regarding bills from these companies.  You will be contacted with the lab results as soon as they are available. The fastest way to get your results is to activate your My Chart account. Instructions are located on the last page of this paperwork. If you have not heard from [place] regarding the results in 2 weeks, please contact this office.

## 2019-06-03 ENCOUNTER — Encounter: Payer: Self-pay | Admitting: Nurse Practitioner

## 2019-06-03 LAB — COMPREHENSIVE METABOLIC PANEL
ALT: 16 IU/L (ref 0–44)
AST: 18 IU/L (ref 0–40)
Albumin/Globulin Ratio: 2.1 (ref 1.2–2.2)
Albumin: 4.6 g/dL (ref 4.0–5.0)
Alkaline Phosphatase: 45 IU/L (ref 39–117)
BUN/Creatinine Ratio: 11 (ref 9–20)
BUN: 11 mg/dL (ref 6–24)
Bilirubin Total: 0.3 mg/dL (ref 0.0–1.2)
CO2: 25 mmol/L (ref 20–29)
Calcium: 9.6 mg/dL (ref 8.7–10.2)
Chloride: 101 mmol/L (ref 96–106)
Creatinine, Ser: 0.99 mg/dL (ref 0.76–1.27)
GFR calc Af Amer: 102 mL/min/{1.73_m2} (ref >59)
GFR calc non Af Amer: 88 mL/min/{1.73_m2} (ref >59)
Globulin, Total: 2.2 g/dL (ref 1.5–4.5)
Glucose: 84 mg/dL (ref 65–99)
Potassium: 4.2 mmol/L (ref 3.5–5.2)
Sodium: 138 mmol/L (ref 134–144)
Total Protein: 6.8 g/dL (ref 6.0–8.5)

## 2019-06-03 LAB — LIPID PANEL
Chol/HDL Ratio: 3.1 ratio (ref 0.0–5.0)
Cholesterol, Total: 177 mg/dL (ref 100–199)
HDL: 57 mg/dL (ref >39)
LDL Chol Calc (NIH): 108 mg/dL — ABNORMAL HIGH (ref 0–99)
Triglycerides: 62 mg/dL (ref 0–149)
VLDL Cholesterol Cal: 12 mg/dL (ref 5–40)

## 2019-06-03 LAB — TSH: TSH: 3.08 u[IU]/mL (ref 0.450–4.500)

## 2019-06-16 ENCOUNTER — Encounter: Payer: Self-pay | Admitting: Nurse Practitioner

## 2019-06-16 ENCOUNTER — Ambulatory Visit (INDEPENDENT_AMBULATORY_CARE_PROVIDER_SITE_OTHER): Payer: BC Managed Care – PPO | Admitting: Nurse Practitioner

## 2019-06-16 ENCOUNTER — Other Ambulatory Visit (INDEPENDENT_AMBULATORY_CARE_PROVIDER_SITE_OTHER): Payer: BC Managed Care – PPO

## 2019-06-16 VITALS — BP 104/84 | HR 60 | Temp 98.0°F | Ht 75.75 in | Wt 217.2 lb

## 2019-06-16 DIAGNOSIS — R109 Unspecified abdominal pain: Secondary | ICD-10-CM

## 2019-06-16 DIAGNOSIS — K760 Fatty (change of) liver, not elsewhere classified: Secondary | ICD-10-CM

## 2019-06-16 LAB — CBC WITH DIFFERENTIAL/PLATELET
Basophils Absolute: 0 10*3/uL (ref 0.0–0.1)
Basophils Relative: 0.6 % (ref 0.0–3.0)
Eosinophils Absolute: 0.1 10*3/uL (ref 0.0–0.7)
Eosinophils Relative: 1.2 % (ref 0.0–5.0)
HCT: 43.3 % (ref 39.0–52.0)
Hemoglobin: 14.3 g/dL (ref 13.0–17.0)
Lymphocytes Relative: 28.4 % (ref 12.0–46.0)
Lymphs Abs: 1.8 10*3/uL (ref 0.7–4.0)
MCHC: 33.1 g/dL (ref 30.0–36.0)
MCV: 86.1 fl (ref 78.0–100.0)
Monocytes Absolute: 0.5 10*3/uL (ref 0.1–1.0)
Monocytes Relative: 7.4 % (ref 3.0–12.0)
Neutro Abs: 4 10*3/uL (ref 1.4–7.7)
Neutrophils Relative %: 62.4 % (ref 43.0–77.0)
Platelets: 242 10*3/uL (ref 150.0–400.0)
RBC: 5.03 Mil/uL (ref 4.22–5.81)
RDW: 13.1 % (ref 11.5–15.5)
WBC: 6.4 10*3/uL (ref 4.0–10.5)

## 2019-06-16 LAB — C-REACTIVE PROTEIN: CRP: <1 mg/dL (ref 0.5–20.0)

## 2019-06-16 MED ORDER — NA SULFATE-K SULFATE-MG SULF 17.5-3.13-1.6 GM/177ML PO SOLN: 1.0000 | Freq: Once | ORAL | 0 refills | Status: AC

## 2019-06-16 NOTE — Patient Instructions (Addendum)
If you are age 50 or older, your body mass index should be between 23-30. Your Body mass index is 26.62 kg/m. If this is out of the aforementioned range listed, please consider follow up with your Primary Care Provider.  If you are age 69 or younger, your body mass index should be between 19-25. Your Body mass index is 26.62 kg/m. If this is out of the aformentioned range listed, please consider follow up with your Primary Care Provider.   You have been scheduled for a CT scan of the abdomen and pelvis at [facility] ([address]---this is in the same building as Press [facility]).   You are scheduled on 06/21/2019 at 8:30 am. You should arrive 15 minutes prior to your appointment time for registration. Please follow the written instructions below on the day of your exam:  WARNING: IF YOU ARE ALLERGIC TO IODINE/X-RAY DYE, PLEASE NOTIFY RADIOLOGY IMMEDIATELY AT [phone]! YOU WILL BE GIVEN A 13 HOUR PREMEDICATION PREP.  1) Do not eat or drink anything after 4:30 am (4 hours prior to your test) 2) You have been given 2 bottles of oral contrast to drink. The solution may taste better if refrigerated, but do NOT add ice or any other liquid to this solution. Shake well before drinking.    Drink 1 bottle of contrast @ 6:30am (2 hours prior to your exam)  Drink 1 bottle of contrast @ 7:30 am (1 hour prior to your exam)  You may take any medications as prescribed with a small amount of water, if necessary. If you take any of the following medications: METFORMIN, GLUCOPHAGE, GLUCOVANCE, AVANDAMET, RIOMET, FORTAMET, [place] MET, JANUMET, GLUMETZA or METAGLIP, you MAY be asked to HOLD this medication 48 hours AFTER the exam.  The purpose of you drinking the oral contrast is to aid in the visualization of your intestinal tract. The contrast solution may cause some diarrhea. Depending on your individual set of symptoms, you may also receive an intravenous injection of x-ray contrast/dye.  Plan on being at [facility] for 30 minutes or longer, depending on the type of exam you are having performed.  This test typically takes 30-45 minutes to complete.   Your provider has requested that you go to the basement level for lab work before leaving today. Press "B" on the elevator. The lab is located at the first door on the left as you exit the elevator.  We have sent the following medications to your pharmacy for you to pick up at your convenience:  suprep for colonoscopy If you have any questions regarding your exam or if you need to reschedule, you may call the CT department at [phone] between the hours of 8:00 am and 5:00 pm, Monday-Friday.  ________________________________________________________________________      Due to recent changes in healthcare laws, you may see the results of your imaging and laboratory studies on MyChart before your provider has had a chance to review them.  We understand that in some cases there may be results that are confusing or concerning to you. Not all laboratory results come back in the same time frame and the provider may be waiting for multiple results in order to interpret others.  Please give [place] 48 hours in order for your provider to thoroughly review all the results before contacting the office for clarification of your results.

## 2019-06-16 NOTE — Progress Notes (Signed)
Agree with assessment and plan as outlined.  

## 2019-06-16 NOTE — Progress Notes (Signed)
06/16/2019 Zachary Diaz [id number] [birth year]   CHIEF COMPLAINT:  Schedule a colonoscopy, right abdominal discomfort   HISTORY OF PRESENT ILLNESS:  Zachary Diaz is a 49 year old male with a past medical history of asthma, hypertension and GERD. He presents today as referred by his PCP Dr. Merri Ray for further evaluation for right mid abdominal pain which started 6 weeks ago and for colon cancer screening.  He reports having a vague right mid abdominal discomfort which started 02/2019. His right abdominal discomfort is worse when sitting or standing and when his stomach is full.  No obvious muscle strain or hernia. No change after defecation. He describes his right mid abdominal discomfort as "weird, feels like something is there, cannot localize". His discomfort is not superficial but feels deep. He typically passes a normal brown stool daily, sometimes less volume of stool. He saw a small amount of bright red blood on the toilet tissue 12/2018 without recurrence. No fever, sweats or chills. No weight loss. He underwent an abdominal sonogram 03/21/2019 which showed hepatic steatosis, no evidence of cholelithiasis. He reports having a remote history of GERD in his 89's which was associated with having asthma symptoms at that time.  He denies having any heartburn or dysphagia. He describes his past reflux symptoms as a gag sensation in the morning, feels like something is in his throat. He took Prilosec for a few months when these symptoms started without improvement. He was advised by his PCP to increase his water intake and his gag/lump sensation abated. Since then, these symptoms recur if he is dehydrated. He drinks more water and the gag/lump sensation resolves. No stomach pain. He avoids NSAIDs. No known family history of esophageal, gastric or colon cancer.   CBC Latest Ref Rng & Units 02/09/2019 08/30/2015 08/02/2014  WBC 4.6 - 10.2 K/uL 6.7 5.9 5.4  Hemoglobin 11 - 14.6 g/dL  15.0(A) 14.4 14.6  Hematocrit 29 - 41 % 44.2(A) 44.1 44.6  Platelets 140 - 400 K/uL - 252 234   CMP Latest Ref Rng & Units 06/02/2019 02/09/2019 03/30/2018  Glucose 65 - 99 mg/dL 84 81 84  BUN 6 - 24 mg/dL 11 8 14   Creatinine 0.76 - 1.27 mg/dL 0.99 0.99 1.09  Sodium 134 - 144 mmol/L 138 141 139  Potassium 3.5 - 5.2 mmol/L 4.2 4.5 4.4  Chloride 96 - 106 mmol/L 101 103 101  CO2 20 - 29 mmol/L 25 24 23   Calcium 8.7 - 10.2 mg/dL 9.6 9.6 9.5  Total Protein 6.0 - 8.5 g/dL 6.8 6.8 6.6  Total Bilirubin 0.0 - 1.2 mg/dL 0.3 0.8 0.5  Alkaline Phos 39 - 117 IU/L 45 50 51  AST 0 - 40 IU/L 18 16 13   ALT 0 - 44 IU/L 16 13 11     Abdominal sonogram 03/21/2019:  1. No evidence of cholelithiasis. 2.There is mild diffuse slightly increased and coarsened echogenicity of the hepatic parenchyma  No discrete hepatic lesions. No intrahepatic biliary ductal dilatation. No ascites. Portal vein is patent on color Doppler imaging with normal direction of blood flow towards the liver. Increased, slightly coarsened echogenicity of the hepatic parenchyma as could be seen in the setting of hepatic steatosis. Correlation with LFTs is advised.   Past Medical History:  Diagnosis Date  . Asthma   . GERD (gastroesophageal reflux disease)   . Hypertension    Past Surgical History:  Procedure Laterality Date  . KNEE ARTHROSCOPY Left 2006   Social History:  He drinks one beer or wine or scotch 2 twice weekly or less. No drug use.   Father deceases. Mother with COPD, HTN.  Half sister with liver disease. Son with autism.    Outpatient Encounter Medications as of 06/16/2019  Medication Sig  . finasteride (PROSCAR) 5 MG tablet TAKE 1/4TABLET  BY MOUTH DAILY.  Marland Kitchen lisinopril (ZESTRIL) 5 MG tablet Take 1 tablet (5 mg total) by mouth daily.  . Multiple Vitamin (MULTIVITAMIN) tablet Take 1 tablet by mouth daily.   No facility-administered encounter medications on file as of 06/16/2019.    No Known Allergies  REVIEW OF  SYSTEMS: All other systems reviewed and negative except where noted in the History of Present Illness.   PHYSICAL EXAM: BP 104/84 (BP Location: Left Arm, Patient Position: Sitting, Cuff Size: Normal)   Pulse 60   Temp 98 F (36.7 C)   Ht 6' 3.75" (1.924 m) Comment: height measured without shoes  Wt 217 lb 4 oz (98.5 kg)   BMI 26.62 kg/m  General: Well developed  49 year old male ino acute distress. Head: Normocephalic and atraumatic. Eyes:  Sclerae non-icteric, conjunctive pink. Ears: Normal auditory acuity. Mouth: Dentition intact. No ulcers or lesions.  Neck: Supple, no lymphadenopathy or thyromegaly.  Lungs: Clear bilaterally to auscultation without wheezes, crackles or rhonchi. Heart: Regular rate and rhythm. No murmur, rub or gallop appreciated.  Abdomen: Soft, non distended. Mild tenderness to the mid abdominal area 3 to 4cm below the costal margin and extends to the upper RLQ which does not radiate to the flank area.  No masses. No CVA tenderness. No hepatosplenomegaly. Normoactive bowel sounds x 4 quadrants.  Rectal: Deferred.  Musculoskeletal: Symmetrical with no gross deformities. Skin: Warm and dry. No rash or lesions on visible extremities. Extremities: No edema. Neurological: Alert oriented x 4, no focal deficits.  Psychological:  Alert and cooperative. Normal mood and affect.  ASSESSMENT AND PLAN:  18. 49 year old male with right mid abdominal pain, unclear etiology.  -CBC, CRP -CTAP with oral and IV contrast.  2. Colon cancer screening -Colonoscopy benefits and risks discussed including risk with sedation, risk of bleeding, perforation and infection   3. Hepatic steatosis. Normal LFTs.  -Discussed continued exercise, avoid weight gain, check LFTs annually.   4. HTN on Lisinopril   5. Remote history of ? GERD. Patient has a gag sensation, globus sensation when dehydrated, resolves after he drinks water.  -EGD not ordered.  Further follow up to be determined  after the above evaluation completed       CC:  Wendie Agreste, MD

## 2019-06-21 ENCOUNTER — Ambulatory Visit (INDEPENDENT_AMBULATORY_CARE_PROVIDER_SITE_OTHER)
Admission: RE | Admit: 2019-06-21 | Discharge: 2019-06-21 | Disposition: A | Discharge status: Home / Self Care | Payer: BC Managed Care – PPO | Source: Ambulatory Visit | Attending: Nurse Practitioner | Admitting: Nurse Practitioner

## 2019-06-21 ENCOUNTER — Other Ambulatory Visit: Payer: Self-pay

## 2019-06-21 DIAGNOSIS — K760 Fatty (change of) liver, not elsewhere classified: Secondary | ICD-10-CM

## 2019-06-21 DIAGNOSIS — R109 Unspecified abdominal pain: Secondary | ICD-10-CM

## 2019-06-21 MED ORDER — IOHEXOL 300 MG/ML  SOLN
100.0000 mL | Freq: Once | INTRAMUSCULAR | Status: AC | PRN
  Administered 2019-06-21: 100 mL via INTRAVENOUS

## 2019-07-04 ENCOUNTER — Ambulatory Visit (INDEPENDENT_AMBULATORY_CARE_PROVIDER_SITE_OTHER): Payer: BC Managed Care – PPO | Admitting: Family Medicine

## 2019-07-04 ENCOUNTER — Encounter: Payer: Self-pay | Admitting: Family Medicine

## 2019-07-04 ENCOUNTER — Other Ambulatory Visit: Payer: Self-pay

## 2019-07-04 VITALS — BP 132/83 | HR 56 | Temp 97.6°F | Ht 76.0 in | Wt 219.7 lb

## 2019-07-04 DIAGNOSIS — R1011 Right upper quadrant pain: Secondary | ICD-10-CM | POA: Diagnosis not present

## 2019-07-04 NOTE — Patient Instructions (Addendum)
I agree with PT eval - let me know if Zachary Diaz needs a referral. Episodic tylenol if needed.    If you have lab work done today you will be contacted with your lab results within the next 2 weeks.  If you have not heard from [place] then please contact us. The fastest way to get your results is to register for My Chart.   IF you received an x-ray today, you will receive an invoice from [facility]. Please contact [facility] at [phone] with questions or concerns regarding your invoice.   IF you received labwork today, you will receive an invoice from [place]. Please contact [facility] at [phone] with questions or concerns regarding your invoice.   Our billing staff will not be able to assist you with questions regarding bills from these companies.  You will be contacted with the lab results as soon as they are available. The fastest way to get your results is to activate your My Chart account. Instructions are located on the last page of this paperwork. If you have not heard from [place] regarding the results in 2 weeks, please contact this office.

## 2019-07-04 NOTE — Progress Notes (Signed)
Subjective:  Patient ID: Zachary Diaz, male    DOB: 01-27-1971  Age: 49 y.o. MRN: [id number]  CC:  Chief Complaint  Patient presents with  . Abdominal Pain-right side    f/u and going on several months (since before 02/09/19)    HPI Zachary Diaz presents for   R sided abdominal pain: Evaluated in December with my colleague.  Symptoms for 4 to 6 weeks at that time with radiating pain down abdomen towards the scrotum without other associated symptoms.  CBC, LFTs were reassuring.  Lipase and urinalysis were normal at that time.  Moderate fecal loading but no other concerns on acute abdomen series.  Ultrasound January 25 with increased slightly coarsened echogenicity of hepatic parenchyma, possible hepatic steatosis but no other concerns.  Still with symptoms at his April 8 visit, intermittent right-sided symptoms.  Worse with certain movements.  Less frequent groin radiation, no testicular/genital symptoms.  No change with straining with bowel movement or change with cough/sneeze.  Constipation at times that improved with hydration.  No blood in the stool.  Referred to gastroenterology at last visit. GI eval April 22, note reviewed. CT abdomen pelvis April 27th.  No acute findings in abdomen pelvis, specifically no findings to explain history of right upper quadrant pain. Degenerative changes in L4-5.  Colonoscopy beginning of June.   Still intermittent pulling sensation/soreness in R side. No groin symptoms.  Some chronic low back pain. No worsening.  No bowel or bladder incontinence, no saddle anesthesia, no lower extremity weakness.  Tx: none.  Bowels moving ok - staying hydrated. started to use fiber supplement.    History Patient Active Problem List   Diagnosis Date Noted  . 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 HYPERTENSION 08/04/2007  . EXERCISE INDUCED ASTHMA 08/04/2007  . ASTHMA 08/04/2007   Past Medical History:  Diagnosis Date  . Asthma   . GERD  (gastroesophageal reflux disease)   . Hypertension    Past Surgical History:  Procedure Laterality Date  . KNEE ARTHROSCOPY Left 2006   No Known Allergies Prior to Admission medications   Medication Sig Start Date End Date Taking? Authorizing Provider  finasteride (PROSCAR) 5 MG tablet TAKE 1/4TABLET  BY MOUTH DAILY. 06/02/19  Yes Wendie Agreste, MD  lisinopril (ZESTRIL) 5 MG tablet Take 1 tablet (5 mg total) by mouth daily. 06/02/19  Yes Wendie Agreste, MD  Multiple Vitamin (MULTIVITAMIN) tablet Take 1 tablet by mouth daily.   Yes [provider]   Social History   Socioeconomic History  . Marital status: Married    Spouse name: Colletta [place]  . Number of children: 5  . Years of education: doctorate  . Highest education level: Not on file  Occupational History  . 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 Smoking status: Never Smoker  . Smokeless tobacco: Never Used  Substance and Sexual Activity  . Alcohol use: Yes    Alcohol/week: 5.0 standard drinks    Types: 5 Standard drinks or equivalent per week    Comment: 5-7 weekly average  . Drug use: No  . Sexual activity: Yes  Other Topics Concern  . Not on file  Social History Narrative   Married, 5 children   1 son, autism   1 daughter   Runs   Education: Secretary/administrator   Exercise: Yes   Caffeine- 2 daily   Social Determinants of Health   Financial Resource Strain:   . Difficulty  of Paying Living Expenses:   Food Insecurity:   . Worried About Charity fundraiser in the Last Year:   . Arboriculturist in the Last Year:   Transportation Needs:   . Film/video editor (Medical):   Marland Kitchen Lack of Transportation (Non-Medical):   Physical Activity:   . Days of Exercise per Week:   . Minutes of Exercise per Session:   Stress:   . Feeling of Stress :   Social Connections:   . Frequency of Communication with Friends and Family:   . Frequency of Social Gatherings with Friends and Family:    . Attends Religious Services:   . Active Member of Clubs or Organizations:   . Attends Archivist Meetings:   Marland Kitchen Marital Status:   Intimate Partner Violence:   . Fear of Current or Ex-Partner:   . Emotionally Abused:   Marland Kitchen Physically Abused:   . Sexually Abused:     Review of Systems Per HPI  Objective:   Vitals:   07/04/19 0948  BP: 132/83  Pulse: (!) 56  Temp: 97.6 F (36.4 C)  TempSrc: Temporal  SpO2: 97%  Weight: 219 lb 11.2 oz (99.7 kg)  Height: 6\' 4"  (1.93 m)     Physical Exam Constitutional:      General: He is not in acute distress.    Appearance: He is well-developed.  HENT:     Head: Normocephalic and atraumatic.  Cardiovascular:     Rate and Rhythm: Normal rate.  Pulmonary:     Effort: Pulmonary effort is normal.  Neurological:     Mental Status: He is alert and oriented to person, place, and time.        Assessment & Plan:  Zachary Diaz is a 49 y.o. male . RUQ abdominal pain Previous imaging reassuring, suspect possible muscular cause/possible lumbar spine with radiation.  Trial of physical therapy/physical therapy eval, has therapist he will contact, can place referral if needed.  RTC precautions.   No orders of the defined types were placed in this encounter.  Patient Instructions   I agree with PT eval - let me know if Jenny Reichmann needs a referral. Episodic tylenol if needed.    If you have lab work done today you will be contacted with your lab results within the next 2 weeks.  If you have not heard from [place] then please contact us. The fastest way to get your results is to register for My Chart.   IF you received an x-ray today, you will receive an invoice from [facility]. Please contact [facility] at [phone] with questions or concerns regarding your invoice.   IF you received labwork today, you will receive an invoice from [place]. Please contact [facility] at [phone] with questions or concerns  regarding your invoice.   Our billing staff will not be able to assist you with questions regarding bills from these companies.  You will be contacted with the lab results as soon as they are available. The fastest way to get your results is to activate your My Chart account. Instructions are located on the last page of this paperwork. If you have not heard from [place] regarding the results in 2 weeks, please contact this office.          Signed, Merri Ray, MD [facility]

## 2019-07-12 ENCOUNTER — Encounter: Payer: Self-pay | Admitting: Gastroenterology

## 2019-07-26 ENCOUNTER — Encounter: Payer: BC Managed Care – PPO | Admitting: Gastroenterology

## 2019-09-16 ENCOUNTER — Telehealth: Payer: Self-pay | Admitting: Gastroenterology

## 2019-09-16 NOTE — Telephone Encounter (Signed)
Hey Dr Havery Moros this pt cancelled his procedure scheduled with you on 09/19/2019.  Pt states his wife is in the hospital in [place] and will call back to reschedule

## 2019-09-19 ENCOUNTER — Encounter: Payer: BC Managed Care – PPO | Admitting: Gastroenterology

## 2020-01-16 ENCOUNTER — Ambulatory Visit (AMBULATORY_SURGERY_CENTER): Payer: Self-pay | Admitting: *Deleted

## 2020-01-16 ENCOUNTER — Other Ambulatory Visit: Payer: Self-pay

## 2020-01-16 ENCOUNTER — Encounter: Payer: Self-pay | Admitting: Gastroenterology

## 2020-01-16 VITALS — Ht 76.0 in | Wt 206.0 lb

## 2020-01-16 DIAGNOSIS — Z1211 Encounter for screening for malignant neoplasm of colon: Secondary | ICD-10-CM

## 2020-01-16 NOTE — Progress Notes (Signed)
cov vax x 2   No egg or soy allergy known to patient  No issues with past sedation with any surgeries or procedures no intubation problems in the past  No FH of Malignant Hyperthermia No diet pills per patient No home 02 use per patient  No blood thinners per patient  Pt states  issues with constipation in the last year- adding water and diet chnages help at times-  Diff to go more often than not  Pt states he has a poor family hx  No A fib or A flutter  EMMI video to pt or via [place] 19 guidelines implemented in [place] today with Pt and RN   Suprep at home from previous colon that was scheduled - he thinks he may have refrigerated it - if he did , he will cal for [place] to send in new script for Suprep   Due to the COVID-19 pandemic we are asking patients to follow these guidelines. Please only bring one care partner. Please be aware that your care partner may wait in the car in the parking lot or if they feel like they will be too hot to wait in the car, they may wait in the lobby on the 4th floor. All care partners are required to wear a mask the entire time (we do not have any that we can provide them), they need to practice social distancing, and we will do a Covid check for all patient's and care partners when you arrive. Also we will check their temperature and your temperature. If the care partner waits in their car they need to stay in the parking lot the entire time and we will call them on their cell phone when the patient is ready for discharge so they can bring the car to the front of the building. Also all patient's will need to wear a mask into building.

## 2020-01-30 ENCOUNTER — Ambulatory Visit (AMBULATORY_SURGERY_CENTER): Payer: BC Managed Care – PPO | Admitting: Gastroenterology

## 2020-01-30 ENCOUNTER — Encounter: Payer: Self-pay | Admitting: Gastroenterology

## 2020-01-30 ENCOUNTER — Other Ambulatory Visit: Payer: Self-pay

## 2020-01-30 VITALS — BP 113/76 | HR 51 | Temp 96.9°F | Resp 19 | Ht 76.0 in | Wt 206.0 lb

## 2020-01-30 DIAGNOSIS — D127 Benign neoplasm of rectosigmoid junction: Secondary | ICD-10-CM

## 2020-01-30 DIAGNOSIS — D125 Benign neoplasm of sigmoid colon: Secondary | ICD-10-CM | POA: Diagnosis not present

## 2020-01-30 DIAGNOSIS — K5730 Diverticulosis of large intestine without perforation or abscess without bleeding: Secondary | ICD-10-CM

## 2020-01-30 DIAGNOSIS — K648 Other hemorrhoids: Secondary | ICD-10-CM

## 2020-01-30 DIAGNOSIS — Z1211 Encounter for screening for malignant neoplasm of colon: Secondary | ICD-10-CM

## 2020-01-30 MED ORDER — SODIUM CHLORIDE 0.9 % IV SOLN: 500.0000 mL | Freq: Once | INTRAVENOUS | Status: DC

## 2020-01-30 NOTE — Op Note (Signed)
[place] Patient Name: Zachary Diaz Procedure Date: 01/30/2020 10:28 AM MRN: [id number] Endoscopist: Remo Lipps P. Havery Moros , MD Age: 49 Referring MD:  Date of Birth: 1970-06-19 Gender: Male Account #: [id number] Procedure:                Colonoscopy Indications:              Screening for colorectal malignant neoplasm, This                            is the patient's first colonoscopy, incidentally                            reported to have right sided abdominal discomfort                            with negative RUQ [place] and CT scan Medicines:                Monitored Anesthesia Care Procedure:                Pre-Anesthesia Assessment:                           - Prior to the procedure, a History and Physical                            was performed, and patient medications and                            allergies were reviewed. The patient's tolerance of                            previous anesthesia was also reviewed. The risks                            and benefits of the procedure and the sedation                            options and risks were discussed with the patient.                            All questions were answered, and informed consent                            was obtained. Prior Anticoagulants: The patient has                            taken no previous anticoagulant or antiplatelet                            agents. ASA Grade Assessment: II - A patient with                            mild systemic disease. After reviewing the risks  and benefits, the patient was deemed in                            satisfactory condition to undergo the procedure.                           After obtaining informed consent, the colonoscope                            was passed under direct vision. Throughout the                            procedure, the patient's blood pressure, pulse, and                            oxygen saturations  were monitored continuously. The                            Colonoscope was introduced through the anus and                            advanced to the the terminal ileum, with                            identification of the appendiceal orifice and IC                            valve. The colonoscopy was performed without                            difficulty. The patient tolerated the procedure                            well. The quality of the bowel preparation was                            good. The terminal ileum, ileocecal valve,                            appendiceal orifice, and rectum were photographed. Scope In: 10:32:33 AM Scope Out: 10:56:35 AM Scope Withdrawal Time: 0 hours 14 minutes 31 seconds  Total Procedure Duration: 0 hours 24 minutes 2 seconds  Findings:                 The perianal and digital rectal examinations were                            normal.                           The terminal ileum appeared normal.                           A few medium-mouthed diverticula were found in the  ascending colon.                           Three sessile polyps were found in the                            recto-sigmoid colon. The polyps were 3 to 4 mm in                            size. These polyps were removed with a cold snare.                            Resection and retrieval were complete.                           Three sessile polyps were found in the sigmoid                            colon. The polyps were 3 to 4 mm in size. These                            polyps were removed with a cold snare. Resection                            and retrieval were complete.                           The colon was tortuous.                           Internal hemorrhoids were found during retroflexion.                           The exam was otherwise without abnormality. Complications:            No immediate complications. Estimated blood loss:                             Minimal. Estimated Blood Loss:     Estimated blood loss was minimal. Impression:               - The examined portion of the ileum was normal.                           - Diverticulosis in the ascending colon.                           - Three 3 to 4 mm polyps at the recto-sigmoid                            colon, removed with a cold snare. Resected and                            retrieved.                           -  Three 3 to 4 mm polyps in the sigmoid colon,                            removed with a cold snare. Resected and retrieved.                           - Tortuous colon.                           - Internal hemorrhoids.                           - The examination was otherwise normal. Recommendation:           - Patient has a contact number available for                            emergencies. The signs and symptoms of potential                            delayed complications were discussed with the                            patient. Return to normal activities tomorrow.                            Written discharge instructions were provided to the                            patient.                           - Resume previous diet.                           - Continue present medications.                           - Await pathology results.                           - No cause for abdominal discomfort on this exam,                            which could be musculoskeletal in etiology [name] P. [name], MD 01/30/2020 11:02:28 AM This report has been signed electronically.

## 2020-01-30 NOTE — Progress Notes (Signed)
Pt's states no medical or surgical changes since previsit or office visit. 

## 2020-01-30 NOTE — Progress Notes (Signed)
VS taken by C.W. 

## 2020-01-30 NOTE — Patient Instructions (Signed)
Information on polyps, diverticulosis and hemorrhoids given to you today.  Await pathology results.  Resume previous diet and medications.  YOU HAD AN ENDOSCOPIC PROCEDURE TODAY AT THE [facility]:   Refer to the procedure report that was given to you for any specific questions about what was found during the examination.  If the procedure report does not answer your questions, please call your gastroenterologist to clarify.  If you requested that your care partner not be given the details of your procedure findings, then the procedure report has been included in a sealed envelope for you to review at your convenience later.  YOU SHOULD EXPECT: Some feelings of bloating in the abdomen. Passage of more gas than usual.  Walking can help get rid of the air that was put into your GI tract during the procedure and reduce the bloating. If you had a lower endoscopy (such as a colonoscopy or flexible sigmoidoscopy) you may notice spotting of blood in your stool or on the toilet paper. If you underwent a bowel prep for your procedure, you may not have a normal bowel movement for a few days.  Please Note:  You might notice some irritation and congestion in your nose or some drainage.  This is from the oxygen used during your procedure.  There is no need for concern and it should clear up in a day or so.  SYMPTOMS TO REPORT IMMEDIATELY:   Following lower endoscopy (colonoscopy or flexible sigmoidoscopy):  Excessive amounts of blood in the stool  Significant tenderness or worsening of abdominal pains  Swelling of the abdomen that is new, acute  Fever of 100F or higher   For urgent or emergent issues, a gastroenterologist can be reached at any hour by calling [phone]. Do not use MyChart messaging for urgent concerns.    DIET:  We do recommend a small meal at first, but then you may proceed to your regular diet.  Drink plenty of fluids but you should avoid alcoholic beverages for 24  hours.  ACTIVITY:  You should plan to take it easy for the rest of today and you should NOT DRIVE or use heavy machinery until tomorrow (because of the sedation medicines used during the test).    FOLLOW UP: Our staff will call the number listed on your records 48-72 hours following your procedure to check on you and address any questions or concerns that you may have regarding the information given to you following your procedure. If we do not reach you, we will leave a message.  We will attempt to reach you two times.  During this call, we will ask if you have developed any symptoms of COVID 19. If you develop any symptoms (ie: fever, flu-like symptoms, shortness of breath, cough etc.) before then, please call [phone].  If you test positive for Covid 19 in the 2 weeks post procedure, please call and report this information to us.    If any biopsies were taken you will be contacted by phone or by letter within the next 1-3 weeks.  Please call us at [phone] if you have not heard about the biopsies in 3 weeks.    SIGNATURES/CONFIDENTIALITY: You and/or your care partner have signed paperwork which will be entered into your electronic medical record.  These signatures attest to the fact that that the information above on your After Visit Summary has been reviewed and is understood.  Full responsibility of the confidentiality of this discharge information lies with you   and/or your care-partner. 

## 2020-01-30 NOTE — Progress Notes (Signed)
Report given to PACU, vss 

## 2020-01-30 NOTE — Progress Notes (Signed)
Called to room to assist during endoscopic procedure.  Patient ID and intended procedure confirmed with present staff. Received instructions for my participation in the procedure from the performing physician.  

## 2020-02-01 ENCOUNTER — Telehealth: Payer: Self-pay | Admitting: *Deleted

## 2020-02-01 NOTE — Telephone Encounter (Signed)
  Follow up Call-  Call back number 01/30/2020  Post procedure Call Back phone  # [phone]  Permission to leave phone message Yes  Some recent data might be hidden     Patient questions:  Do you have a fever, pain , or abdominal swelling? No. Pain Score  0 *  Have you tolerated food without any problems? Yes.    Have you been able to return to your normal activities? Yes.    Do you have any questions about your discharge instructions: Diet   No. Medications  No. Follow up visit  No.  Do you have questions or concerns about your Care? No.  Actions: * If pain score is 4 or above: 1. No action needed, pain <4.Have you developed a fever since your procedure? no  2.   Have you had an respiratory symptoms (SOB or cough) since your procedure? no  3.   Have you tested positive for COVID 19 since your procedure no  4.   Have you had any family members/close contacts diagnosed with the COVID 19 since your procedure?  no   If yes to any of these questions please route to Joylene [name], RN and Joella Prince, RN

## 2020-04-10 ENCOUNTER — Encounter: Payer: Self-pay | Admitting: Family Medicine

## 2020-04-11 ENCOUNTER — Ambulatory Visit (INDEPENDENT_AMBULATORY_CARE_PROVIDER_SITE_OTHER): Payer: BC Managed Care – PPO | Admitting: Family Medicine

## 2020-04-11 ENCOUNTER — Other Ambulatory Visit: Payer: Self-pay

## 2020-04-11 ENCOUNTER — Encounter: Payer: Self-pay | Admitting: Family Medicine

## 2020-04-11 DIAGNOSIS — R110 Nausea: Secondary | ICD-10-CM

## 2020-04-11 DIAGNOSIS — H538 Other visual disturbances: Secondary | ICD-10-CM

## 2020-04-11 DIAGNOSIS — R519 Headache, unspecified: Secondary | ICD-10-CM | POA: Diagnosis not present

## 2020-04-11 DIAGNOSIS — M542 Cervicalgia: Secondary | ICD-10-CM

## 2020-04-11 DIAGNOSIS — M549 Dorsalgia, unspecified: Secondary | ICD-10-CM
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
## 2020-04-16 ENCOUNTER — Other Ambulatory Visit: Payer: Self-pay

## 2020-04-16 ENCOUNTER — Ambulatory Visit
Admission: RE | Admit: 2020-04-16 | Discharge: 2020-04-16 | Disposition: A | Discharge status: Home / Self Care | Payer: BC Managed Care – PPO | Source: Ambulatory Visit | Attending: Family Medicine | Admitting: Family Medicine

## 2020-04-16 DIAGNOSIS — R110 Nausea: Secondary | ICD-10-CM

## 2020-04-16 DIAGNOSIS — R519 Headache, unspecified: Secondary | ICD-10-CM

## 2020-04-16 DIAGNOSIS — M549 Dorsalgia, unspecified: Secondary | ICD-10-CM

## 2020-04-16 DIAGNOSIS — H538 Other visual disturbances: Secondary | ICD-10-CM

## 2020-04-16 DIAGNOSIS — M542 Cervicalgia: Secondary | ICD-10-CM

## 2020-04-19 ENCOUNTER — Telehealth (INDEPENDENT_AMBULATORY_CARE_PROVIDER_SITE_OTHER): Payer: BC Managed Care – PPO | Admitting: Family Medicine

## 2020-04-19 ENCOUNTER — Other Ambulatory Visit: Payer: Self-pay

## 2020-04-19 VITALS — Temp 98.5°F | Ht 76.0 in | Wt 200.0 lb

## 2020-04-19 DIAGNOSIS — M542 Cervicalgia: Secondary | ICD-10-CM | POA: Diagnosis not present

## 2020-04-19 DIAGNOSIS — M549 Dorsalgia, unspecified: Secondary | ICD-10-CM | POA: Diagnosis not present

## 2020-04-19 DIAGNOSIS — R519 Headache, unspecified: Secondary | ICD-10-CM

## 2020-04-19 MED ORDER — METHOCARBAMOL 500 MG PO TABS: 500.0000 mg | ORAL_TABLET | Freq: Three times a day (TID) | ORAL | 0 refills | Status: DC | PRN
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
## 2020-06-18 ENCOUNTER — Other Ambulatory Visit: Payer: Self-pay

## 2020-06-18 ENCOUNTER — Other Ambulatory Visit: Payer: Self-pay | Admitting: Family Medicine

## 2020-06-18 ENCOUNTER — Telehealth: Payer: Self-pay | Admitting: Family Medicine

## 2020-06-18 DIAGNOSIS — I10 Essential (primary) hypertension: Secondary | ICD-10-CM

## 2020-06-18 MED ORDER — LISINOPRIL 5 MG PO TABS: 5.0000 mg | ORAL_TABLET | Freq: Every day | ORAL | 1 refills | Status: DC

## 2020-06-18 NOTE — Telephone Encounter (Signed)
Medication sent to patient's pharmacy.

## 2020-06-18 NOTE — Telephone Encounter (Signed)
..  Medication Refills  Last OV:  Medication:  Lisinopril  Pharmacy:  [facility] on [address]  Let patient know to contact pharmacy at the end of the day to make sure medication is ready.   Please notify patient to allow 48-72 hours to process.  Encourage patient to contact the pharmacy for refills or they can request refills through [place] out below:   Last refill:  QTY:  Refill Date:    Other Comments:   Okay for refill?  Please advise.

## 2020-08-14 ENCOUNTER — Other Ambulatory Visit: Payer: Self-pay | Admitting: Family Medicine

## 2020-08-14 DIAGNOSIS — L649 Androgenic alopecia, unspecified: Secondary | ICD-10-CM

## 2020-08-16 ENCOUNTER — Other Ambulatory Visit: Payer: Self-pay

## 2020-08-16 ENCOUNTER — Encounter: Payer: Self-pay | Admitting: Family Medicine

## 2020-08-16 ENCOUNTER — Ambulatory Visit (INDEPENDENT_AMBULATORY_CARE_PROVIDER_SITE_OTHER): Payer: BC Managed Care – PPO | Admitting: Family Medicine

## 2020-08-16 VITALS — BP 128/76 | HR 50 | Temp 98.3°F | Resp 17 | Ht 76.0 in | Wt 202.6 lb

## 2020-08-16 DIAGNOSIS — Z1329 Encounter for screening for other suspected endocrine disorder: Secondary | ICD-10-CM

## 2020-08-16 DIAGNOSIS — Z0000 Encounter for general adult medical examination without abnormal findings: Secondary | ICD-10-CM

## 2020-08-16 DIAGNOSIS — Z23 Encounter for immunization: Secondary | ICD-10-CM | POA: Diagnosis not present

## 2020-08-16 DIAGNOSIS — Z13220 Encounter for screening for lipoid disorders: Secondary | ICD-10-CM

## 2020-08-16 DIAGNOSIS — Z131 Encounter for screening for diabetes mellitus: Secondary | ICD-10-CM

## 2020-08-16 DIAGNOSIS — L649 Androgenic alopecia, unspecified: Secondary | ICD-10-CM

## 2020-08-16 DIAGNOSIS — I10 Essential (primary) hypertension: Secondary | ICD-10-CM | POA: Diagnosis not present

## 2020-08-16 DIAGNOSIS — Z125 Encounter for screening for malignant neoplasm of prostate: Secondary | ICD-10-CM | POA: Diagnosis not present

## 2020-08-16 MED ORDER — LISINOPRIL 5 MG PO TABS: 5.0000 mg | ORAL_TABLET | Freq: Every day | ORAL | 3 refills | Status: DC

## 2020-08-16 NOTE — Patient Instructions (Signed)
Keeping you healthy  Get these tests Blood pressure- Have your blood pressure checked once a year by your healthcare provider.  Normal blood pressure is 120/80 Weight- Have your body mass index (BMI) calculated to screen for obesity.  BMI is a measure of body fat based on height and weight. You can also calculate your own BMI at ViewBanking.si. Cholesterol- Have your cholesterol checked every year. Diabetes- Have your blood sugar checked regularly if you have high blood pressure, high cholesterol, have a family history of diabetes or if you are overweight. Screening for Colon Cancer- Colonoscopy starting at age 52.  Screening may begin sooner depending on your family history and other health conditions. Follow up colonoscopy as directed by your Gastroenterologist. Screening for Prostate Cancer- Both blood work (PSA) and a rectal exam help screen for Prostate Cancer.  Screening begins at age 34 with African-American men and at age 45 with Caucasian men.  Screening may begin sooner depending on your family history.  Take these medicines Flu shot- Every fall. Tetanus- Every 10 years. second shingles vaccine in 2-6 months.  Pneumonia shot- Once after the age of 67; if you are younger than 59, ask your healthcare provider if you need a Pneumonia shot.  Take these steps Don't smoke- If you do smoke, talk to your doctor about quitting.  For tips on how to quit, go to www.smokefree.gov or call 1-800-QUIT-NOW. Be physically active- Exercise 5 days a week for at least 30 minutes.  If you are not already physically active start slow and gradually work up to 30 minutes of moderate physical activity.  Examples of moderate activity include walking briskly, mowing the yard, dancing, swimming, bicycling, etc. Eat a healthy diet- Eat a variety of healthy food such as fruits, vegetables, low fat milk, low fat cheese, yogurt, lean meant, poultry, fish, beans, tofu, etc. For more information go to  www.thenutritionsource.org Drink alcohol in moderation- Limit alcohol intake to less than two drinks a day. Never drink and drive. Dentist- Brush and floss twice daily; visit your dentist twice a year. Depression- Your emotional health is as important as your physical health. If you're feeling down, or losing interest in things you would normally enjoy please talk to your healthcare provider. Eye exam- Visit your eye doctor every year. Safe sex- If you may be exposed to a sexually transmitted infection, use a condom. Seat belts- Seat belts can save your life; always wear one. Smoke/Carbon Monoxide detectors- These detectors need to be installed on the appropriate level of your home.  Replace batteries at least once a year. Skin cancer- When out in the sun, cover up and use sunscreen 15 SPF or higher. Violence- If anyone is threatening you, please tell your healthcare provider. Living Will/ Health care power of attorney- Speak with your healthcare provider and family.
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
## 2020-08-17 ENCOUNTER — Telehealth: Payer: Self-pay | Admitting: Family Medicine

## 2020-08-17 LAB — COMPREHENSIVE METABOLIC PANEL
ALT: 10 U/L (ref 0–53)
AST: 13 U/L (ref 0–37)
Albumin: 4.5 g/dL (ref 3.5–5.2)
Alkaline Phosphatase: 39 U/L (ref 39–117)
BUN: 12 mg/dL (ref 6–23)
CO2: 27 mEq/L (ref 19–32)
Calcium: 9.7 mg/dL (ref 8.4–10.5)
Chloride: 102 mEq/L (ref 96–112)
Creatinine, Ser: 1.01 mg/dL (ref 0.40–1.50)
GFR: 86.13 mL/min (ref >60.00)
Glucose, Bld: 80 mg/dL (ref 70–99)
Potassium: 4.4 mEq/L (ref 3.5–5.1)
Sodium: 139 mEq/L (ref 135–145)
Total Bilirubin: 1.1 mg/dL (ref 0.2–1.2)
Total Protein: 6.7 g/dL (ref 6.0–8.3)

## 2020-08-17 LAB — HEPATITIS C ANTIBODY
Hepatitis C Ab: NONREACTIVE
SIGNAL TO CUT-OFF: 0.01 (ref <1.00)

## 2020-08-17 LAB — CBC WITH DIFFERENTIAL/PLATELET
Basophils Absolute: 0.1 10*3/uL (ref 0.0–0.1)
Basophils Relative: 1 % (ref 0.0–3.0)
Eosinophils Absolute: 0.1 10*3/uL (ref 0.0–0.7)
Eosinophils Relative: 1 % (ref 0.0–5.0)
HCT: 42.1 % (ref 39.0–52.0)
Hemoglobin: 13.7 g/dL (ref 13.0–17.0)
Lymphocytes Relative: 27.9 % (ref 12.0–46.0)
Lymphs Abs: 1.9 10*3/uL (ref 0.7–4.0)
MCHC: 32.6 g/dL (ref 30.0–36.0)
MCV: 85.6 fl (ref 78.0–100.0)
Monocytes Absolute: 0.4 10*3/uL (ref 0.1–1.0)
Monocytes Relative: 6 % (ref 3.0–12.0)
Neutro Abs: 4.4 10*3/uL (ref 1.4–7.7)
Neutrophils Relative %: 64.1 % (ref 43.0–77.0)
Platelets: 229 10*3/uL (ref 150.0–400.0)
RBC: 4.92 Mil/uL (ref 4.22–5.81)
RDW: 14 % (ref 11.5–15.5)
WBC: 6.8 10*3/uL (ref 4.0–10.5)

## 2020-08-17 LAB — PSA: PSA: 0.06 ng/mL — ABNORMAL LOW (ref 0.10–4.00)

## 2020-08-17 LAB — LIPID PANEL
Cholesterol: 178 mg/dL (ref 0–200)
HDL: 56.3 mg/dL (ref >39.00)
LDL Cholesterol: 105 mg/dL — ABNORMAL HIGH (ref 0–99)
NonHDL: 121.33
Total CHOL/HDL Ratio: 3
Triglycerides: 84 mg/dL (ref 0.0–149.0)
VLDL: 16.8 mg/dL (ref 0.0–40.0)

## 2020-08-17 LAB — TSH: TSH: 1.9 u[IU]/mL (ref 0.35–4.50)

## 2020-08-17 LAB — HEMOGLOBIN A1C: Hgb A1c MFr Bld: 5.5 % (ref 4.6–6.5)

## 2020-08-17 NOTE — Telephone Encounter (Signed)
See labs 

## 2020-12-12 ENCOUNTER — Other Ambulatory Visit: Payer: Self-pay | Admitting: Family Medicine

## 2020-12-12 DIAGNOSIS — I10 Essential (primary) hypertension: Secondary | ICD-10-CM

## 2021-04-15 ENCOUNTER — Encounter: Payer: Self-pay | Admitting: Nurse Practitioner

## 2021-04-15 ENCOUNTER — Ambulatory Visit (INDEPENDENT_AMBULATORY_CARE_PROVIDER_SITE_OTHER): Payer: BC Managed Care – PPO | Admitting: Nurse Practitioner

## 2021-04-15 ENCOUNTER — Telehealth: Payer: Self-pay

## 2021-04-15 VITALS — BP 100/76 | HR 64 | Ht 75.75 in | Wt 209.2 lb

## 2021-04-15 DIAGNOSIS — R194 Change in bowel habit: Secondary | ICD-10-CM

## 2021-04-15 DIAGNOSIS — R1011 Right upper quadrant pain: Secondary | ICD-10-CM

## 2021-04-15 DIAGNOSIS — K580 Irritable bowel syndrome with diarrhea: Secondary | ICD-10-CM

## 2021-04-15 MED ORDER — RIFAXIMIN 550 MG PO TABS: 550.0000 mg | ORAL_TABLET | Freq: Three times a day (TID) | ORAL | 0 refills | Status: DC

## 2021-04-15 MED ORDER — DICYCLOMINE HCL 10 MG PO CAPS: 10.0000 mg | ORAL_CAPSULE | Freq: Three times a day (TID) | ORAL | 1 refills | Status: DC

## 2021-04-15 NOTE — Telephone Encounter (Signed)
Prior Authorization for Xifaxan submitted via CoverMyMeds.   Awaiting response from insurance.

## 2021-04-15 NOTE — Patient Instructions (Signed)
MEDICATION: We have sent the following medication to your pharmacy for you to pick up at your convenience: Xifaxan. Take 1 tablet three times a day for 14 days. We may need to submit a prior authorization through your insurance.  Dicyclomine 10 MG tablet. Take 1 tablet up to 4 times a day if needed for abdominal pain/cramping. You may take it prior to breakfast and dinner.   BMI:  If you are age 52 or older, your body mass index should be between 23-30. Your Body mass index is 25.64 kg/m. If this is out of the aforementioned range listed, please consider follow up with your Primary Care Provider.  If you are age 82 or younger, your body mass index should be between 19-25. Your Body mass index is 25.64 kg/m. If this is out of the aformentioned range listed, please consider follow up with your Primary Care Provider.   MY CHART:  The [facility] providers would like to encourage you to use [facility] to communicate with providers for non-urgent requests or questions.  Due to long hold times on the telephone, sending your provider a message by [facility] may be a faster and more efficient way to get a response.  Please allow 48 business hours for a response.  Please remember that this is for non-urgent requests.   Thank you for trusting me with your gastrointestinal care!    Noralyn Pick, CRNP

## 2021-04-15 NOTE — Progress Notes (Signed)
04/15/2021 Zachary Diaz [id number] 1970/02/26   Chief Complaint: Right upper abdominal pain  History of Present Illness: Zachary Diaz is a 51 year old male with a past medical history of asthma, hypertension and GERD. No past abdominal surgeries.   I initially saw Zachary Diaz in our office 06/16/2019 for further evaluation regarding right mid abdominal pain and to schedule a screening colonoscopy.  Abdominal/pelvic CT with contrast 06/21/2019 was normal, etiology for his right mid to upper abdominal pain was not identified.  He underwent a colonoscopy 01/30/2020 which identified 3 tubular adenomatous and 3 hyperplastic polyps which were removed from the sigmoid and rectosigmoid colon, his colon was tortuous with diverticulosis noted in the ascending colon and internal hemorrhoids.  He was advised by Dr. Havery Moros repeat colonoscopy in 5 years.  He presents today for further evaluation regarding persistent squeezing RUQ pain which worsens after he eats.  His RUQ pain is at the mid rib cage border and he sometimes feels like there is a pulling sensation from his groin to this area.  He feels as if something is not right.  No specific food triggers.  He also complains of having intermittent constipation which resolves after he drinks more water and takes fiber and MiraLAX.  For the past few months, he awakens early in the morning with the urgency and passes a nonbloody brown watery bowel movement with mucus.  He eats breakfast and later passes 5 to 6 more bowel movement that looks like "dirt or snake like stool".  Yesterday he passed 2  soft serve ice cream like bowel movements which were large volume.  No rectal bleeding or black stools.  No recent antibiotics or new medications.  Dairy intake is limited.  He drinks 1 cup of black coffee in the morning and tea at lunch.  Stress level is chronically elevated.  No recent GERD symptoms.  No fever, sweats or chills.  No weight  loss.  CBC Latest Ref Rng & Units 08/16/2020 06/16/2019 02/09/2019  WBC 4.0 - 10.5 K/uL 6.8 6.4 6.7  Hemoglobin 13.0 - 17.0 g/dL 13.7 14.3 15.0(A)  Hematocrit 39.0 - 52.0 % 42.1 43.3 44.2(A)  Platelets 150.0 - 400.0 K/uL 229.0 242.0 -    CMP Latest Ref Rng & Units 08/16/2020 06/02/2019 02/09/2019  Glucose 70 - 99 mg/dL 80 84 81  BUN 6 - 23 mg/dL 12 11 8   Creatinine 0.40 - 1.50 mg/dL 1.01 0.99 0.99  Sodium 135 - 145 mEq/L 139 138 141  Potassium 3.5 - 5.1 mEq/L 4.4 4.2 4.5  Chloride 96 - 112 mEq/L 102 101 103  CO2 19 - 32 mEq/L 27 25 24   Calcium 8.4 - 10.5 mg/dL 9.7 9.6 9.6  Total Protein 6.0 - 8.3 g/dL 6.7 6.8 6.8  Total Bilirubin 0.2 - 1.2 mg/dL 1.1 0.3 0.8  Alkaline Phos 39 - 117 U/L 39 45 50  AST 0 - 37 U/L 13 18 16   ALT 0 - 53 U/L 10 16 13       Colonoscopy 01/30/2020: - The examined portion of the ileum was normal. - Diverticulosis in the ascending colon. - Three 3 to 4 mm polyps at the recto-sigmoid colon, removed with a cold snare. Resected and retrieved. - Three 3 to 4 mm polyps in the sigmoid colon, removed with a cold snare. Resected and retrieved. - Tortuous colon. - Internal hemorrhoids. - The examination was otherwise normal. - 5 years - TUBULAR ADENOMA (THREE). - NO HIGH GRADE DYSPLASIA OR  CARCINOMA. - HYPERPLASTIC POLYP (THREE). Zachary Diaz  Current Outpatient Medications on File Prior to Visit  Medication Sig Dispense Refill   finasteride (PROSCAR) 5 MG tablet TAKE 1/4 TABLET DAILY 90 tablet 1   lisinopril (ZESTRIL) 5 MG tablet TAKE 1 TABLET(5 MG) BY MOUTH DAILY 90 tablet 3   Multiple Vitamin (MULTIVITAMIN) tablet Take 1 tablet by mouth daily.     polyethylene glycol powder (GLYCOLAX/MIRALAX) 17 GM/SCOOP powder Take 17 g by mouth as needed.     No current facility-administered medications on file prior to visit.   No Known Allergies   Current Medications, Allergies, Past Medical History, Past Surgical History, Family History and Social History were reviewed in  [employer] record.   Review of Systems:   Constitutional: Negative for fever, sweats, chills or weight loss.  Respiratory: Negative for shortness of breath.   Cardiovascular: Negative for chest pain, palpitations and leg swelling.  Gastrointestinal: See HPI.  Musculoskeletal: Negative for back pain or muscle aches.  Neurological: Negative for dizziness, headaches or paresthesias.    Physical Exam: BP 100/76 (BP Location: Left Arm, Patient Position: Sitting, Cuff Size: Normal)    Pulse 64 Comment: irregular   Ht 6' 3.75" (1.924 m)    Wt 209 lb 4 oz (94.9 kg)    BMI 25.64 kg/m   Wt Readings from Last 3 Encounters:  04/15/21 209 lb 4 oz (94.9 kg)  08/16/20 202 lb 9.6 oz (91.9 kg)  04/19/20 200 lb (90.7 kg)    General: 50 year old male in no acute distress. Head: Normocephalic and atraumatic. Eyes: No scleral icterus. Conjunctiva pink . Ears: Normal auditory acuity. Mouth: Dentition intact. No ulcers or lesions.  Lungs: Clear throughout to auscultation. Heart: Regular rate and rhythm, no murmur. Abdomen: Soft, nondistended.  Mild RUQ tenderness at the medial rib cage border. No masses or hepatomegaly. Normal bowel sounds x 4 quadrants.  Rectal: Deferred. Musculoskeletal: Symmetrical with no gross deformities. Extremities: No edema. Neurological: Alert oriented x 4. No focal deficits.  Psychological: Alert and cooperative. Normal mood and affect  Assessment and Recommendations:  1) Altered bowel pattern, nonbloody watery diarrhea in the morning followed by 5-6 dark to string-like stools.  Suspect IBS vs SIBO. Colonoscopy 01/2020 without evidence of colitis. -Xifaxan 550 mg 1 p.o. 3 times daily for 14 days -Probiotic of choice daily after Xifaxan course completed  2) Chronic RUQ pain, described as a squeezing type pain which is triggered by eating.  RUQ sonogram 03/21/2019 showed a normal gallbladder with evidence of possible hepatic steatosis.  CTAP with  contrast 06/21/2019 showed a normal gallbladder, liver, duodenum and colon. -I discussed scheduling an EGD to further evaluate his upper GI tract/duodenum -Patient prefers to defer EGD for now, will consider EGD if no improvement after he completes the above prescribed course of Xifaxan -Dicyclomine 10 mg 4 times daily as needed -Patient to schedule follow-up appoint with Dr. Havery Moros, he preferred to complete the's course of Xifaxan prior to scheduling a follow-up appointment.  3) History of 3 tubular adenomatous and 3 hyperplastic polyps per colonoscopy 01/30/2020. -Next colonoscopy due 01/2025

## 2021-04-16 DIAGNOSIS — R1011 Right upper quadrant pain: Secondary | ICD-10-CM | POA: Insufficient documentation

## 2021-04-16 NOTE — Progress Notes (Signed)
Agree with assessment and plan as outlined.  If postprandial pain continues I do think EGD is reasonable to further evaluate.  Would also consider HIDA scan to ensure no gallbladder dyskinesia.  Thanks

## 2021-04-18 NOTE — Telephone Encounter (Signed)
PA approved.

## 2021-04-26 NOTE — Telephone Encounter (Signed)
Pt left a voicemail checking on status. ?

## 2021-04-28 ENCOUNTER — Other Ambulatory Visit: Payer: Self-pay | Admitting: Nurse Practitioner

## 2021-04-28 MED ORDER — METRONIDAZOLE 250 MG PO TABS: 250.0000 mg | ORAL_TABLET | Freq: Two times a day (BID) | ORAL | 0 refills | Status: AC

## 2021-04-28 NOTE — Telephone Encounter (Signed)
See my chart msg to patient  ?

## 2021-06-05 IMAGING — CT CT ABD-PELV W/ CM
2 of 5 series · 16 of 46 positions shown, 18 images · IV contrast (OMNIPAQUE 300)
Comparison: None.

CLINICAL DATA: Right upper quadrant pain for over 6 months.

EXAM:
CT ABDOMEN AND PELVIS WITH CONTRAST
TECHNIQUE: Multidetector CT imaging of the abdomen and pelvis was performed
using the standard protocol following bolus administration of
intravenous contrast.
CONTRAST:  100mL OMNIPAQUE IOHEXOL 300 MG/ML  SOLN

[Series 2: abd/pel w · axial · 0.73mm/px · z∈[-496,-86]mm · 13 of 92 slices shown, 15 images]
[im 5/92  soft-tissue]
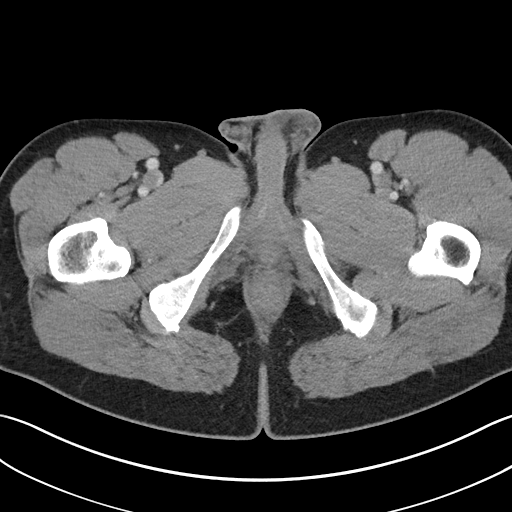
[im 5/92  bone]
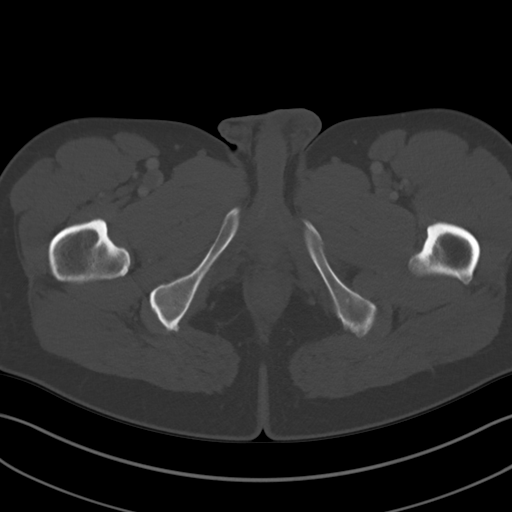
[im 15/92  soft-tissue]
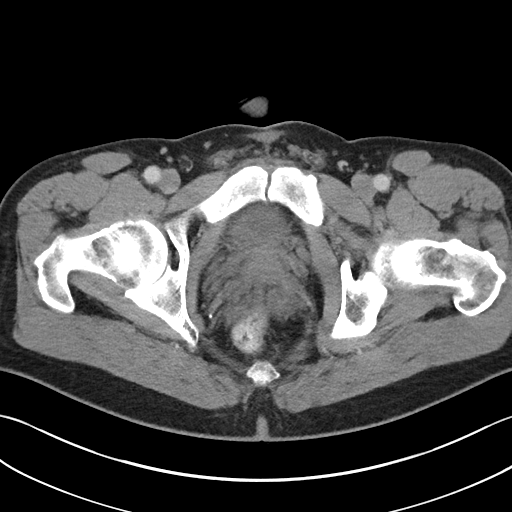
[im 20/92  soft-tissue]
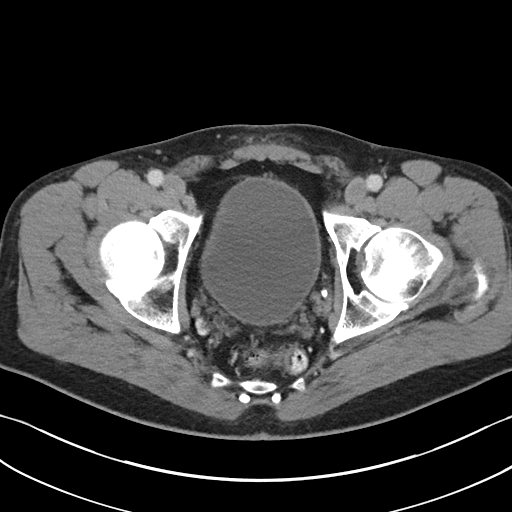
[im 24/92  soft-tissue]
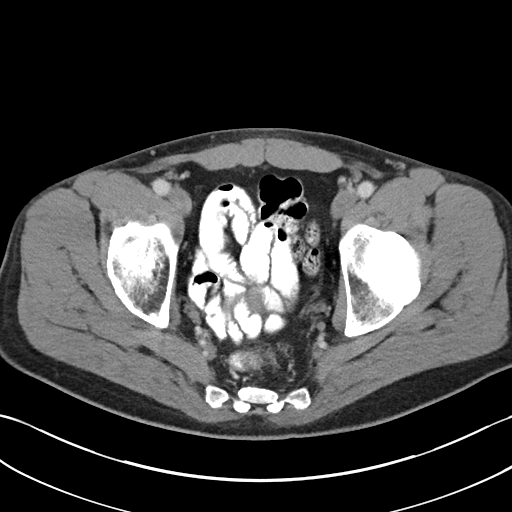
[im 34/92  soft-tissue]
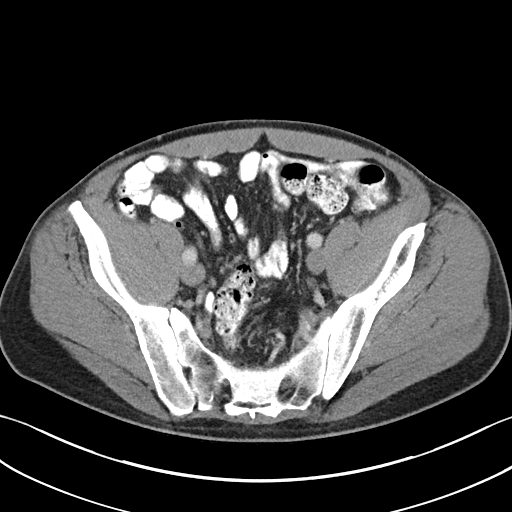
[im 39/92  soft-tissue]
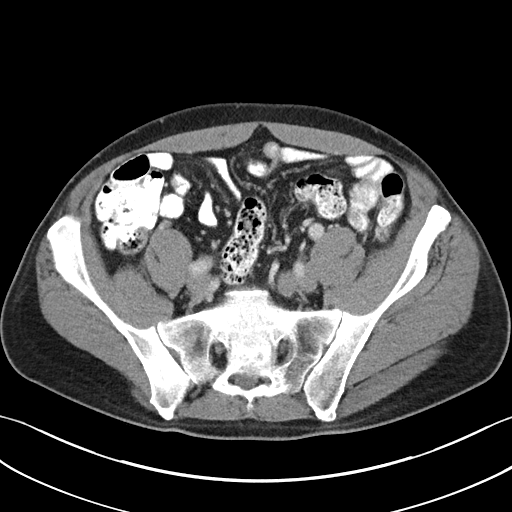
[im 48/92  soft-tissue]
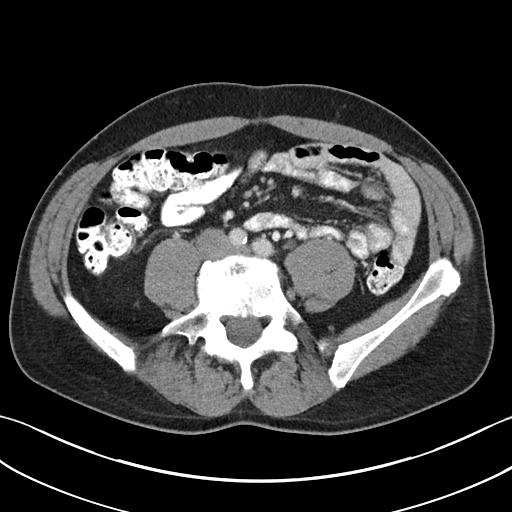
[im 53/92  soft-tissue]
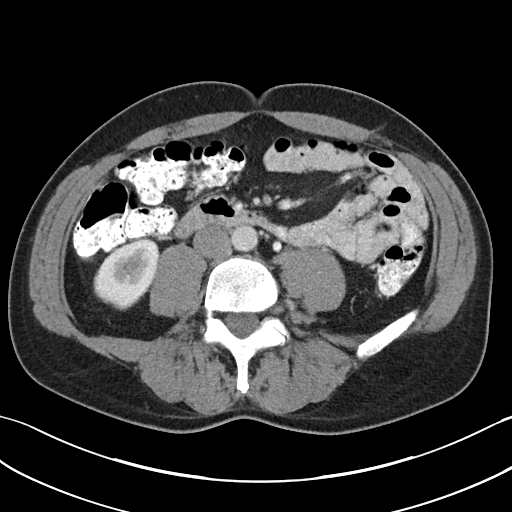
[im 58/92  soft-tissue]
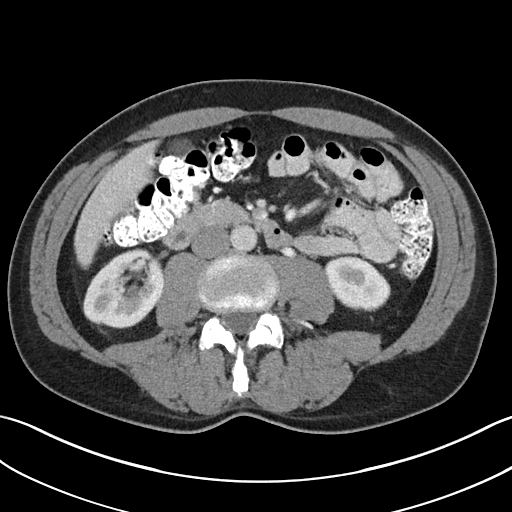
[im 58/92  bone]
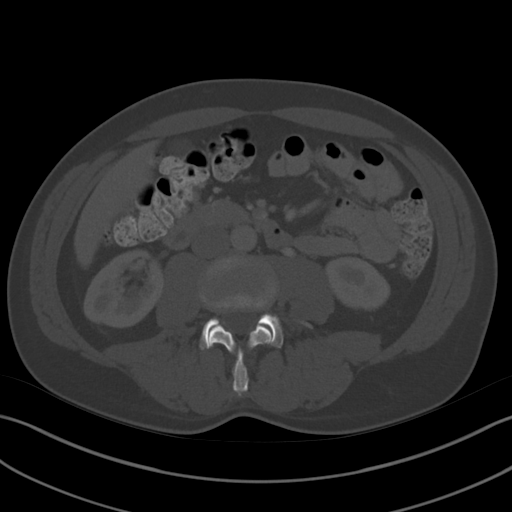
[im 68/92  soft-tissue]
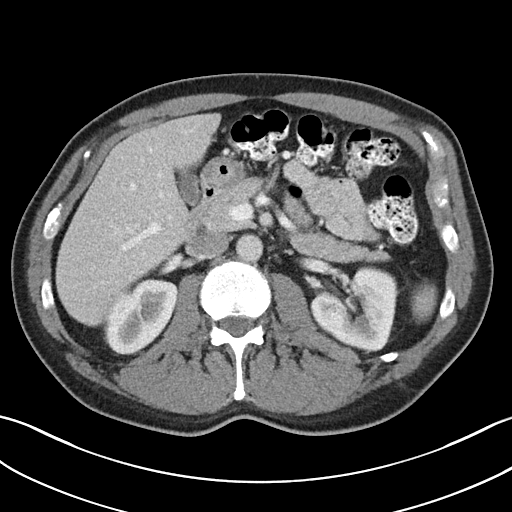
[im 72/92  soft-tissue]
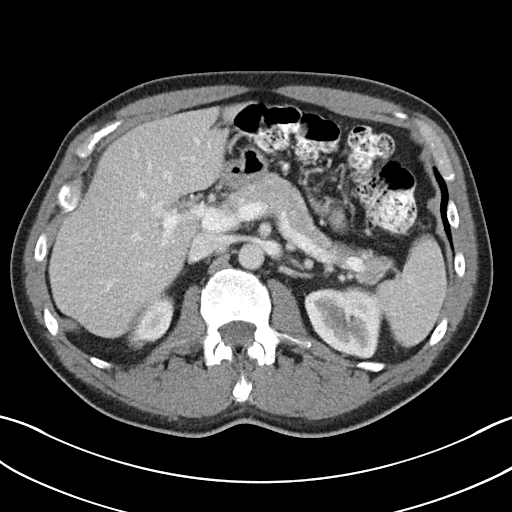
[im 77/92  soft-tissue]
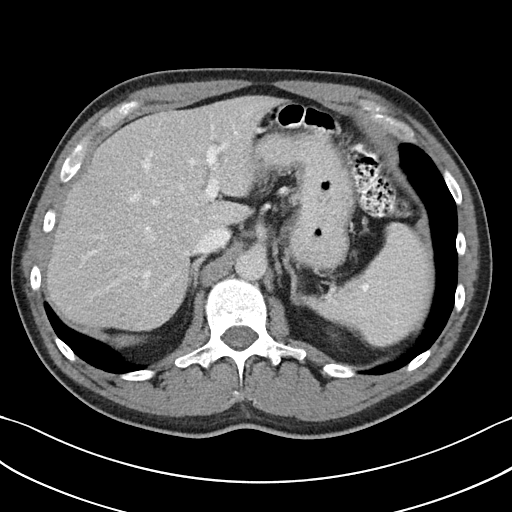
[im 87/92  soft-tissue]
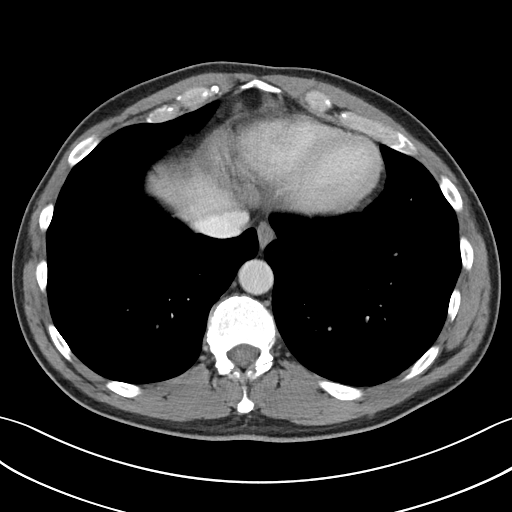

[Series 5: abd/pel w st · coronal · 0.71mm/px · 3 of 83 slices shown]
[im 28/83  soft-tissue]
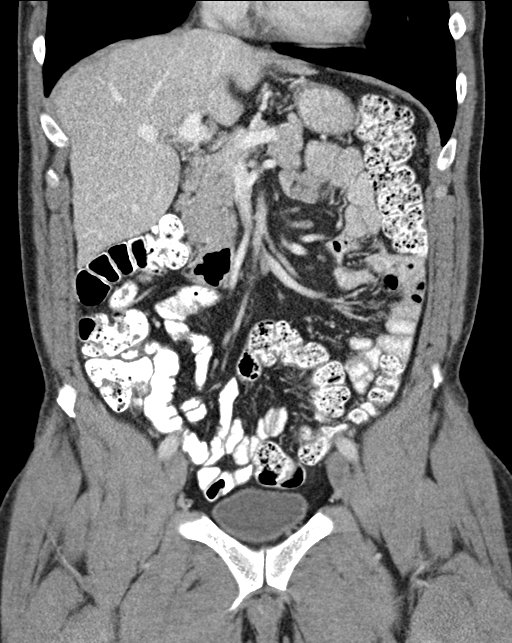
[im 37/83  soft-tissue]
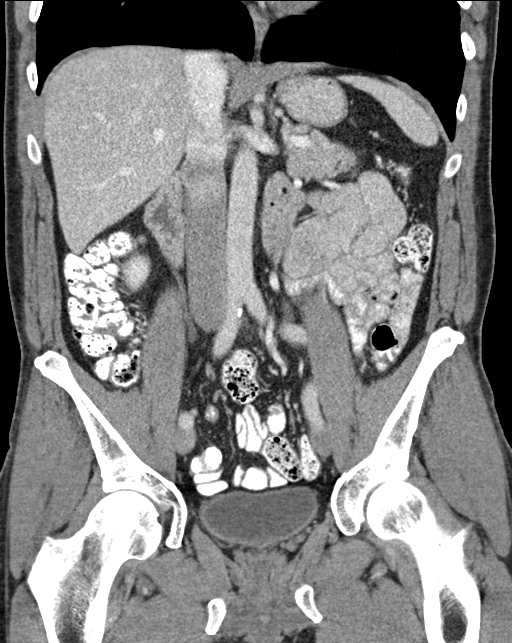
[im 46/83  soft-tissue]
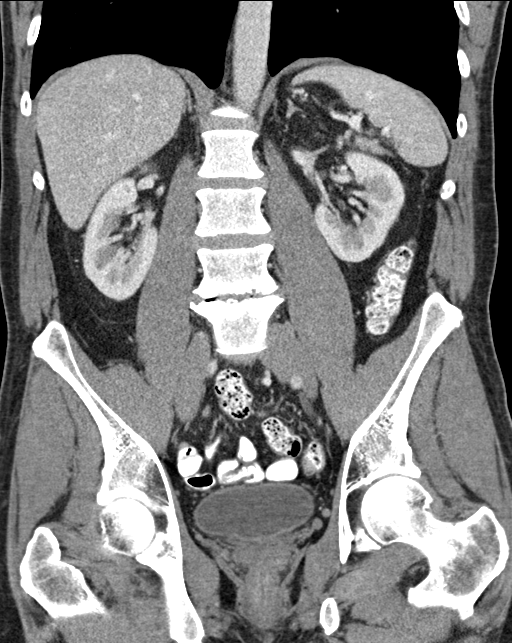

[16 of 46 positions shown; findings below may reference images not displayed]

FINDINGS: Lower chest: Unremarkable

Hepatobiliary: No suspicious focal abnormality within the liver
parenchyma. There is no evidence for gallstones, gallbladder wall
thickening, or pericholecystic fluid. No intrahepatic or
extrahepatic biliary dilation.

Pancreas: No focal mass lesion. No dilatation of the main duct. No
intraparenchymal cyst. No peripancreatic edema.

Spleen: No splenomegaly. No focal mass lesion.

Adrenals/Urinary Tract: No adrenal nodule or mass. Kidneys
unremarkable. No evidence for hydroureter. The urinary bladder
appears normal for the degree of distention.

Stomach/Bowel: Stomach is unremarkable. No gastric wall thickening.
No evidence of outlet obstruction. Duodenum is normally positioned
as is the ligament of Treitz. No small bowel wall thickening. No
small bowel dilatation. The terminal ileum is normal. The appendix
is normal. No gross colonic mass. No colonic wall thickening.

Vascular/Lymphatic: No abdominal aortic aneurysm. No abdominal
aortic atherosclerotic calcification. There is no gastrohepatic or
hepatoduodenal ligament lymphadenopathy. No retroperitoneal or
mesenteric lymphadenopathy. No pelvic sidewall lymphadenopathy.

Reproductive: The prostate gland and seminal vesicles are
unremarkable.

Other: No intraperitoneal free fluid.

Musculoskeletal: No worrisome lytic or sclerotic osseous
abnormality. Degenerative changes noted L4-5.
IMPRESSION: No acute findings in the abdomen or pelvis. Specifically, no
findings to explain the patient's history of right upper quadrant
pain.

## 2021-08-21 ENCOUNTER — Encounter: Payer: Self-pay | Admitting: Family Medicine

## 2021-08-21 ENCOUNTER — Ambulatory Visit (INDEPENDENT_AMBULATORY_CARE_PROVIDER_SITE_OTHER): Payer: BC Managed Care – PPO | Admitting: Family Medicine

## 2021-08-21 ENCOUNTER — Telehealth: Payer: Self-pay | Admitting: Family Medicine

## 2021-08-21 VITALS — BP 118/70 | HR 53 | Temp 98.1°F | Resp 16 | Ht 75.0 in | Wt 203.6 lb

## 2021-08-21 DIAGNOSIS — Z23 Encounter for immunization: Secondary | ICD-10-CM | POA: Diagnosis not present

## 2021-08-21 DIAGNOSIS — I10 Essential (primary) hypertension: Secondary | ICD-10-CM | POA: Diagnosis not present

## 2021-08-21 DIAGNOSIS — Z125 Encounter for screening for malignant neoplasm of prostate: Secondary | ICD-10-CM | POA: Diagnosis not present

## 2021-08-21 DIAGNOSIS — Z13220 Encounter for screening for lipoid disorders: Secondary | ICD-10-CM

## 2021-08-21 DIAGNOSIS — Z0000 Encounter for general adult medical examination without abnormal findings: Secondary | ICD-10-CM

## 2021-08-21 DIAGNOSIS — Z131 Encounter for screening for diabetes mellitus: Secondary | ICD-10-CM

## 2021-08-21 DIAGNOSIS — L649 Androgenic alopecia, unspecified: Secondary | ICD-10-CM

## 2021-08-21 LAB — COMPREHENSIVE METABOLIC PANEL
ALT: 11 U/L (ref 0–53)
AST: 14 U/L (ref 0–37)
Albumin: 4.5 g/dL (ref 3.5–5.2)
Alkaline Phosphatase: 48 U/L (ref 39–117)
BUN: 15 mg/dL (ref 6–23)
CO2: 29 mEq/L (ref 19–32)
Calcium: 9.8 mg/dL (ref 8.4–10.5)
Chloride: 101 mEq/L (ref 96–112)
Creatinine, Ser: 1.1 mg/dL (ref 0.40–1.50)
GFR: 77.2 mL/min (ref >60.00)
Glucose, Bld: 86 mg/dL (ref 70–99)
Potassium: 4.6 mEq/L (ref 3.5–5.1)
Sodium: 139 mEq/L (ref 135–145)
Total Bilirubin: 0.5 mg/dL (ref 0.2–1.2)
Total Protein: 6.9 g/dL (ref 6.0–8.3)

## 2021-08-21 LAB — LIPID PANEL
Cholesterol: 181 mg/dL (ref 0–200)
HDL: 54.6 mg/dL (ref >39.00)
LDL Cholesterol: 112 mg/dL — ABNORMAL HIGH (ref 0–99)
NonHDL: 126.12
Total CHOL/HDL Ratio: 3
Triglycerides: 71 mg/dL (ref 0.0–149.0)
VLDL: 14.2 mg/dL (ref 0.0–40.0)

## 2021-08-21 LAB — PSA: PSA: 0.1 ng/mL (ref 0.10–4.00)

## 2021-08-21 LAB — HEMOGLOBIN A1C: Hgb A1c MFr Bld: 5.4 % (ref 4.6–6.5)

## 2021-08-21 MED ORDER — LISINOPRIL 5 MG PO TABS: ORAL_TABLET | ORAL | 3 refills | Status: DC

## 2021-08-21 MED ORDER — FINASTERIDE 5 MG PO TABS: ORAL_TABLET | ORAL | 1 refills | Status: DC

## 2021-08-21 NOTE — Patient Instructions (Addendum)
If still having intestinal symptoms I do recommend following up with gastroenterology, but glad to hear that is better.   Here are a few options for counseling:  [facility]:  [facility] Address: [address], [place], [place] [address] Phone: [phone]  I would consider the bivalent vaccine when you are ready.   No med changes today. Thanks for coming in and please let me know if there are questions.   Preventive Care 39-61 Years Old, Male Preventive care refers to lifestyle choices and visits with your health care provider that can promote health and wellness. Preventive care visits are also called wellness exams. What can I expect for my preventive care visit? Counseling During your preventive care visit, your health care provider may ask about your: Medical history, including: Past medical problems. Family medical history. Current health, including: Emotional well-being. Home life and relationship well-being. Sexual activity. Lifestyle, including: Alcohol, nicotine or tobacco, and drug use. Access to firearms. Diet, exercise, and sleep habits. Safety issues such as seatbelt and bike helmet use. Sunscreen use. Work and work Statistician. Physical exam Your health care provider will check your: Height and weight. These may be used to calculate your BMI (body mass index). BMI is a measurement that tells if you are at a healthy weight. Waist circumference. This measures the distance around your waistline. This measurement also tells if you are at a healthy weight and may help predict your risk of certain diseases, such as type 2 diabetes and high blood pressure. Heart rate and blood pressure. Body temperature. Skin for abnormal spots. What immunizations do I need?  Vaccines are usually given at various ages, according to a schedule. Your health care provider will recommend  vaccines for you based on your age, medical history, and lifestyle or other factors, such as travel or where you work. What tests do I need? Screening Your health care provider may recommend screening tests for certain conditions. This may include: Lipid and cholesterol levels. Diabetes screening. This is done by checking your blood sugar (glucose) after you have not eaten for a while (fasting). Hepatitis B test. Hepatitis C test. HIV (human immunodeficiency virus) test. STI (sexually transmitted infection) testing, if you are at risk. Lung cancer screening. Prostate cancer screening. Colorectal cancer screening. Talk with your health care provider about your test results, treatment options, and if necessary, the need for more tests. Follow these instructions at home: Eating and drinking  Eat a diet that includes fresh fruits and vegetables, whole grains, lean protein, and low-fat dairy products. Take vitamin and mineral supplements as recommended by your health care provider. Do not drink alcohol if your health care provider tells you not to drink. If you drink alcohol: Limit how much you have to 0-2 drinks a day. Know how much alcohol is in your drink. In the [place] one drink equals one 12 oz bottle of beer (355 mL), one 5 oz glass of wine (148 mL), or one 1 oz glass of hard liquor (44 mL). Lifestyle Brush your teeth every morning and night with fluoride toothpaste. Floss one time each day. Exercise for at least 30 minutes 5 or more days each week. Do not use any products that contain nicotine or tobacco. These products include cigarettes, chewing tobacco, and vaping devices, such as e-cigarettes. If you need help quitting, ask your health care provider. Do not use drugs. If you are sexually active, practice safe sex. Use a  condom or other form of protection to prevent STIs. Take aspirin only as told by your health care provider. Make sure that you understand how much to take and what  form to take. Work with your health care provider to find out whether it is safe and beneficial for you to take aspirin daily. Find healthy ways to manage stress, such as: Meditation, yoga, or listening to music. Journaling. Talking to a trusted person. Spending time with friends and family. Minimize exposure to UV radiation to reduce your risk of skin cancer. Safety Always wear your seat belt while driving or riding in a vehicle. Do not drive: If you have been drinking alcohol. Do not ride with someone who has been drinking. When you are tired or distracted. While texting. If you have been using any mind-altering substances or drugs. Wear a helmet and other protective equipment during sports activities. If you have firearms in your house, make sure you follow all gun safety procedures. What's next? Go to your health care provider once a year for an annual wellness visit. Ask your health care provider how often you should have your eyes and teeth checked. Stay up to date on all vaccines. This information is not intended to replace advice given to you by your health care provider. Make sure you discuss any questions you have with your health care provider. Document Revised: 08/08/2020 Document Reviewed: 08/08/2020 Elsevier Patient Education  [place].
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
## 2021-08-21 NOTE — Telephone Encounter (Signed)
In your sign folder to be reviewed

## 2021-08-21 NOTE — Progress Notes (Signed)
Subjective:  Patient ID: Zachary Diaz, male    DOB: [birth year]  Age: 51 y.o. MRN: [id number]  CC:  Chief Complaint  Patient presents with   Annual Exam    no concerns, fasting     HPI [name] presents for Annual Exam  Care Team: PCP, me Gastroenterology Dr. Havery Moros, Carl Best NP with [facility], appointment in February.  IBS versus SIBO.  Colonoscopy December 2021 without colitis.  Started on Xifaxan probiotic of choice after Xifaxan completed.  Dicyclomine for right upper quadrant pain with option of EGD if persistent next colonoscopy 02/12/2025. Some improvement. Better after baby tooth removed that had infection. Did not take xifaxan d/t cost, treated with metronidazole - no changes. No pain, still fecal urgency at times.   Hypertension: Lisinopril 5 mg daily, no new side effects.  Home readings: rare - stable.  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 Stable, no further recession.  Stress with spouses's health. Separated in August of last year. Kids are doing ok, happier. Has talked to counselor prior- not good fit.      08/21/2021    8:00 AM 08/16/2020    2:00 PM 04/19/2020    3:17 PM 04/11/2020    9:33 AM 07/04/2019    9:49 AM  Depression screen PHQ 2/9  Decreased Interest 0 0 0 0 0  Down, Depressed, Hopeless 0 0 0 0 0  PHQ - 2 Score 0 0 0 0 0  Altered sleeping 1 0     Tired, decreased energy 1 0     Change in appetite 0 0     Feeling bad or failure about yourself  0 0     Trouble concentrating 0 0     Moving slowly or fidgety/restless 0 0     Suicidal thoughts 0 0     PHQ-9 Score 2 0       Health Maintenance  Topic Date Due   Zoster Vaccines- Shingrix (2 of 2) 10/11/2020   COVID-19 Vaccine (4 - Pfizer series) 09/06/2021 (Originally 03/11/2020)   INFLUENZA  VACCINE  09/24/2021   COLONOSCOPY (Pts 45-50yr Insurance coverage will need to be confirmed)  01/29/2025   TETANUS/TDAP  03/30/2028   Hepatitis C Screening  Completed   HIV Screening  Completed   HPV VACCINES  Aged Out  Colonoscopy December 2021 with repeat in 5 years due to polyps. Prostate: does not have family history of prostate cancer The natural history of prostate cancer and ongoing controversy regarding screening and potential treatment outcomes of prostate cancer has been discussed with the patient. The meaning of a false positive PSA and a false negative PSA has been discussed. He indicates understanding of the limitations of this screening test and wishes to proceed with screening PSA testing. Has seen dermatologist in the past for benign nevus Lab Results  Component Value Date   PSA1 <0.1 03/30/2018   PSA 0.06 (L) 08/16/2020      Immunization History  Administered Date(s) Administered   Influenza Inj Mdck Quad Pf 12/28/2017   Influenza Split 12/23/2011, 12/14/2012, 11/23/2014   Influenza,inj,Quad PF,6+ Mos 12/01/2016, 11/26/2018   Influenza-Unspecified 11/28/2013, 11/14/2015, 12/04/2018, 01/15/2020, 01/15/2020   PFIZER(Purple Top)SARS-COV-2 Vaccination 05/09/2019, 05/30/2019, 01/15/2020   Pneumococcal Polysaccharide-23 11/25/2010   Tdap 02/25/2008, 03/30/2018   Zoster Recombinat (Shingrix) 08/16/2020  2nd shingrix today.  Bivalent covid vaccine recommended. Most recent covid infection March 2022.   No results found. Optho yearly - no recent changes, contact lenses.   Dental: every 6 months  Alcohol:5-7 per week (weekends).   Tobacco: none  Exercise: 19mn 4-5d/week.   History Patient Active Problem List   Diagnosis Date Noted   RUQ pain 04/16/2021   Male pattern baldness 07/14/2012   HYPERTENSION 08/04/2007   EXERCISE INDUCED ASTHMA 08/04/2007   ASTHMA 08/04/2007   Past Medical History:  Diagnosis Date   Asthma    GERD (gastroesophageal reflux  disease)    Hypertension    Past Surgical History:  Procedure Laterality Date   KNEE ARTHROSCOPY Left 2006   wisdom teeth     No Known Allergies Prior to Admission medications   Medication Sig Start Date End Date Taking? Authorizing Provider  finasteride (PROSCAR) 5 MG tablet TAKE 1/4 TABLET DAILY 08/14/20  Yes GWendie Agreste MD  lisinopril (ZESTRIL) 5 MG tablet TAKE 1 TABLET(5 MG) BY MOUTH DAILY 12/12/20  Yes GWendie Agreste MD  Multiple Vitamin (MULTIVITAMIN) tablet Take 1 tablet by mouth daily.   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  Tobacco Use   Smoking status: Never   Smokeless tobacco: Never  Vaping Use   Vaping Use: Never used  Substance and Sexual Activity   Alcohol use: Yes    Alcohol/week: 5.0 standard drinks of alcohol    Types: 5 Standard drinks or equivalent per week    Comment: 5-7 weekly average   Drug use: No   Sexual activity: Yes  Other Topics Concern   Not on file  Social History Narrative   Married, 5 children   1 son, autism   1 daughter   Runs   Education: CSecretary/administrator  Exercise: Yes   Caffeine- 1 daily   Social Determinants of HRadio broadcast assistantStrain: Not on file  Food Insecurity: Not on file  Transportation Needs: Not on file  Physical Activity: Not on file  Stress: Not on file  Social Connections: Not on file  Intimate Partner Violence: Not on file    Review of Systems 13 point review of systems per patient health survey noted.  Negative other than as indicated above or in HPI.    Objective:   Vitals:   08/21/21 0801  BP: 118/70  Pulse: (!) 53  Resp: 16  Temp: 98.1 F (36.7 C)  TempSrc: Temporal  SpO2: 97%  Weight: 203 lb 9.6 oz (92.4 kg)  Height:  '6\' 3"'$  (1.905 m)     Physical Exam Vitals reviewed.  Constitutional:      Appearance: He is well-developed.  HENT:     Head: Normocephalic and atraumatic.     Right Ear: External ear normal.     Left Ear: External ear normal.  Eyes:     Conjunctiva/sclera: Conjunctivae normal.     Pupils: Pupils are equal, round, and reactive to light.  Neck:     Thyroid: No thyromegaly.  Cardiovascular:     Rate and Rhythm: Normal rate and regular rhythm.     Heart sounds: Normal heart sounds.  Pulmonary:     Effort:  Pulmonary effort is normal. No respiratory distress.     Breath sounds: Normal breath sounds. No wheezing.  Abdominal:     General: There is no distension.     Palpations: Abdomen is soft.     Tenderness: There is no abdominal tenderness. There is no guarding.  Musculoskeletal:        General: No tenderness. Normal range of motion.     Cervical back: Normal range of motion and neck supple.  Lymphadenopathy:     Cervical: No cervical adenopathy.  Skin:    General: Skin is warm and dry.  Neurological:     Mental Status: He is alert and oriented to person, place, and time.     Deep Tendon Reflexes: Reflexes are normal and symmetric.  Psychiatric:        Behavior: Behavior normal.        Assessment & Plan:  RAYN ENDERSON is a 51 y.o. male . Annual physical exam - Plan: Comprehensive metabolic panel, Lipid panel, Hemoglobin A1c, PSA  - -anticipatory guidance as below in AVS, screening labs above. Health maintenance items as above in HPI discussed/recommended as applicable.   -COVID bivalent booster recommended.  Shingrix No. 2 given today.  -Numbers for counseling provided if needed with stress/coping.  -Abdominal symptoms have improved but recommended follow-up with gastroenterology if persistent symptoms.  Screening for hyperlipidemia - Plan: Comprehensive metabolic panel, Lipid panel  Need for shingles vaccine - Plan: Varicella-zoster vaccine IM  Screening  for diabetes mellitus - Plan: Comprehensive metabolic panel, Hemoglobin A1c  Screening for prostate cancer - Plan: Comprehensive metabolic panel, PSA  Essential hypertension - Plan: lisinopril (ZESTRIL) 5 MG tablet  -  Stable, tolerating current regimen. Medications refilled. Labs pending as above.   Male pattern baldness - Plan: finasteride (PROSCAR) 5 MG tablet  -  Stable, tolerating current regimen. Medications refilled.   Meds ordered this encounter  Medications   lisinopril (ZESTRIL) 5 MG tablet    Sig: TAKE 1 TABLET(5 MG) BY MOUTH DAILY    Dispense:  90 tablet    Refill:  3   finasteride (PROSCAR) 5 MG tablet    Sig: TAKE 1/4 TABLET DAILY    Dispense:  90 tablet    Refill:  1   Patient Instructions  If still having intestinal symptoms I do recommend following up with gastroenterology, but glad to hear that is better.   Here are a few options for counseling:  [facility]:  [facility] Address: [address], [place], [place] [address] Phone: [phone]  I would consider the bivalent vaccine when you are ready.   No med changes today. Thanks for coming in and please let me know if there are questions.   Preventive Care 81-84 Years Old, Male Preventive care refers to lifestyle choices and visits with your health care provider that can promote health and wellness. Preventive care visits are also called wellness exams. What can I expect for my preventive care visit? Counseling During your preventive care visit, your health care provider may ask about your: Medical history, including: Past medical problems. Family medical history. Current health, including: Emotional well-being. Home life and relationship well-being. Sexual activity. Lifestyle, including: Alcohol, nicotine or tobacco, and drug use. Access to firearms. Diet, exercise, and sleep habits. Safety issues such  as seatbelt and bike helmet use. Sunscreen use. Work and work Statistician. Physical exam Your health care provider will check your: Height and  weight. These may be used to calculate your BMI (body mass index). BMI is a measurement that tells if you are at a healthy weight. Waist circumference. This measures the distance around your waistline. This measurement also tells if you are at a healthy weight and may help predict your risk of certain diseases, such as type 2 diabetes and high blood pressure. Heart rate and blood pressure. Body temperature. Skin for abnormal spots. What immunizations do I need?  Vaccines are usually given at various ages, according to a schedule. Your health care provider will recommend vaccines for you based on your age, medical history, and lifestyle or other factors, such as travel or where you work. What tests do I need? Screening Your health care provider may recommend screening tests for certain conditions. This may include: Lipid and cholesterol levels. Diabetes screening. This is done by checking your blood sugar (glucose) after you have not eaten for a while (fasting). Hepatitis B test. Hepatitis C test. HIV (human immunodeficiency virus) test. STI (sexually transmitted infection) testing, if you are at risk. Lung cancer screening. Prostate cancer screening. Colorectal cancer screening. Talk with your health care provider about your test results, treatment options, and if necessary, the need for more tests. Follow these instructions at home: Eating and drinking  Eat a diet that includes fresh fruits and vegetables, whole grains, lean protein, and low-fat dairy products. Take vitamin and mineral supplements as recommended by your health care provider. Do not drink alcohol if your health care provider tells you not to drink. If you drink alcohol: Limit how much you have to 0-2 drinks a day. Know how much alcohol is in your drink. In the [place] one drink  equals one 12 oz bottle of beer (355 mL), one 5 oz glass of wine (148 mL), or one 1 oz glass of hard liquor (44 mL). Lifestyle Brush your teeth every morning and night with fluoride toothpaste. Floss one time each day. Exercise for at least 30 minutes 5 or more days each week. Do not use any products that contain nicotine or tobacco. These products include cigarettes, chewing tobacco, and vaping devices, such as e-cigarettes. If you need help quitting, ask your health care provider. Do not use drugs. If you are sexually active, practice safe sex. Use a condom or other form of protection to prevent STIs. Take aspirin only as told by your health care provider. Make sure that you understand how much to take and what form to take. Work with your health care provider to find out whether it is safe and beneficial for you to take aspirin daily. Find healthy ways to manage stress, such as: Meditation, yoga, or listening to music. Journaling. Talking to a trusted person. Spending time with friends and family. Minimize exposure to UV radiation to reduce your risk of skin cancer. Safety Always wear your seat belt while driving or riding in a vehicle. Do not drive: If you have been drinking alcohol. Do not ride with someone who has been drinking. When you are tired or distracted. While texting. If you have been using any mind-altering substances or drugs. Wear a helmet and other protective equipment during sports activities. If you have firearms in your house, make sure you follow all gun safety procedures. What's next? Go to your health care provider once a year for an annual wellness visit. Ask your health care provider how often you should have your eyes and teeth checked. Stay up to date on all vaccines. This information is  not intended to replace advice given to you by your health care provider. Make sure you discuss any questions you have with your health care provider. Document Revised:  08/08/2020 Document Reviewed: 08/08/2020 Elsevier Patient Education  [place],   Merri Ray, MD [place] 08/21/21 8:36 AM
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
## 2021-08-29 NOTE — Telephone Encounter (Signed)
Do you have this paperwork by any chance

## 2021-08-29 NOTE — Telephone Encounter (Signed)
I do have the paperwork but has not been completed.  I did send a message to the patient last week that I would complete it once I return unless it needed to be submitted sooner.  I plan on working on that early next week once I can review those notes further.  JG

## 2021-08-30 NOTE — Telephone Encounter (Signed)
Spoke with Zachary Diaz let her know you have been out of the office and that you will be able to complete this next we she stated as long as it is completed by the end of next week then that would be great.

## 2021-09-05 NOTE — Telephone Encounter (Signed)
Never mind - I see it in media.

## 2021-09-05 NOTE — Telephone Encounter (Signed)
Can we please verify that the attorney has a copy of the patient's medical record and if there is a signed authorization for release of information?

## 2021-09-10 NOTE — Telephone Encounter (Signed)
Paperwork completed and faxed.  °

## 2021-09-11 NOTE — Telephone Encounter (Signed)
Faxed

## 2021-12-13 ENCOUNTER — Other Ambulatory Visit: Payer: Self-pay | Admitting: Family Medicine

## 2021-12-13 DIAGNOSIS — I10 Essential (primary) hypertension: Secondary | ICD-10-CM

## 2022-08-25 ENCOUNTER — Encounter: Payer: BC Managed Care – PPO | Admitting: Family Medicine

## 2022-09-04 ENCOUNTER — Encounter: Payer: BC Managed Care – PPO | Admitting: Family Medicine

## 2022-09-20 ENCOUNTER — Other Ambulatory Visit: Payer: Self-pay | Admitting: Family Medicine

## 2022-09-20 DIAGNOSIS — L649 Androgenic alopecia, unspecified: Secondary | ICD-10-CM

## 2022-09-24 ENCOUNTER — Encounter: Payer: Self-pay | Admitting: Family Medicine

## 2022-09-24 ENCOUNTER — Ambulatory Visit (INDEPENDENT_AMBULATORY_CARE_PROVIDER_SITE_OTHER): Payer: 59 | Admitting: Family Medicine

## 2022-09-24 VITALS — BP 128/72 | HR 56 | Temp 98.2°F | Ht 75.0 in | Wt 209.2 lb

## 2022-09-24 DIAGNOSIS — Z131 Encounter for screening for diabetes mellitus: Secondary | ICD-10-CM

## 2022-09-24 DIAGNOSIS — Z13220 Encounter for screening for lipoid disorders: Secondary | ICD-10-CM

## 2022-09-24 DIAGNOSIS — I10 Essential (primary) hypertension: Secondary | ICD-10-CM

## 2022-09-24 DIAGNOSIS — Z130 Encounter for screening for diseases of the blood and blood-forming organs and certain disorders involving the immune mechanism: Secondary | ICD-10-CM

## 2022-09-24 DIAGNOSIS — R251 Tremor, unspecified: Secondary | ICD-10-CM

## 2022-09-24 DIAGNOSIS — Z0000 Encounter for general adult medical examination without abnormal findings: Secondary | ICD-10-CM

## 2022-09-24 DIAGNOSIS — R253 Fasciculation: Secondary | ICD-10-CM

## 2022-09-24 DIAGNOSIS — Z0001 Encounter for general adult medical examination with abnormal findings: Secondary | ICD-10-CM

## 2022-09-24 DIAGNOSIS — Z1329 Encounter for screening for other suspected endocrine disorder: Secondary | ICD-10-CM

## 2022-09-24 DIAGNOSIS — L649 Androgenic alopecia, unspecified: Secondary | ICD-10-CM

## 2022-09-24 MED ORDER — FINASTERIDE 5 MG PO TABS
ORAL_TABLET | ORAL | 1 refills | Status: AC
Start: 2022-09-24 — End: ?

## 2022-09-24 MED ORDER — LISINOPRIL 5 MG PO TABS
ORAL_TABLET | ORAL | 3 refills | Status: DC
Start: 2022-09-24 — End: 2023-09-29

## 2022-09-24 NOTE — Progress Notes (Unsigned)
Subjective:  Patient ID: Zachary Diaz, male    DOB: [birth year]  Age: 52 y.o. MRN: [id number]  CC:  Chief Complaint  Patient presents with   Annual Exam    Pt notes doing well has had some recent muscle spasms notes more stress and questions if there was anything he can do for those     HPI Zachary Diaz presents for Annual Exam PCP, [facility], Dr. Adela Lank.  Next colonoscopy due 02/12/2025.  IBS versus SIBO with fecal urgency in the past. Fasting today.   Hypertension: Lisinopril 5 mg daily. No new side effects.  Home readings: 120-130/80's.  BP Readings from Last 3 Encounters:  09/24/22 128/72  08/21/21 118/70  04/15/21 100/76   Lab Results  Component Value Date   CREATININE 1.10 08/21/2021   Male pattern baldness Finasteride 1/4 tablet/day. Maintaining - no new hair loss.      09/24/2022    1:49 PM 08/21/2021    8:00 AM 08/16/2020    2:00 PM 04/19/2020    3:17 PM 04/11/2020    9:33 AM  Depression screen PHQ 2/9  Decreased Interest 0 0 0 0 0  Down, Depressed, Hopeless 0 0 0 0 0  PHQ - 2 Score 0 0 0 0 0  Altered sleeping 0 1 0    Tired, decreased energy 1 1 0    Change in appetite 0 0 0    Feeling bad or failure about yourself  0 0 0    Trouble concentrating 0 0 0    Moving slowly or fidgety/restless 0 0 0    Suicidal thoughts 0 0 0    PHQ-9 Score 1 2 0     Muscle spasms, twitching.  As above. Has notices holding stress in different areas physically.  Twitching, tightening of left bicep at times. R hamstring. Sometimes in calf. Primarily left bicep past few months. Locks up/cramps and sore after it contracts. Chronic slight tremor - no recent changes, no prior neuro eval.  Better about diet - less GI symptoms. Less exercise overall with other time commitments.   Some increased stress. 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  Still separated, but that has been overall a better situation. Busy with other commitments. Started school a few years  ago, still on the board.  Dating someone new since February - that relationship is going well.  5-6 hrs sleep per night.  Occasional CBD, melatonin at night is helpful.    Health Maintenance  Topic Date Due   COVID-19 Vaccine (4 - 2023-24 season) 10/25/2021   INFLUENZA VACCINE  09/25/2022   Colonoscopy  01/29/2025   DTaP/Tdap/Td (3 - Td or Tdap) 03/30/2028   Hepatitis C Screening  Completed   HIV Screening  Completed   Zoster Vaccines- Shingrix  Completed   HPV VACCINES  Aged Out  Colonoscopy, last completed December 2021, history of polyps. Repeat 41yrs.  Prostate: does not have family history of prostate cancer The natural history of prostate cancer and ongoing controversy regarding screening and potential treatment outcomes of prostate cancer has been discussed with the patient. The meaning of a false positive PSA and a false negative PSA has been discussed. He indicates understanding of the limitations of this screening test and wishes  to proceed with screening PSA testing Lab Results  Component Value Date   PSA1 <0.1 03/30/2018   PSA 0.10 08/21/2021   PSA 0.06 (L) 08/16/2020  Has seen dermatologist in the past for benign  nevus. No new skin lesions.   Immunization History  Administered Date(s) Administered   Influenza Inj Mdck Quad Pf 12/28/2017   Influenza Split 12/23/2011, 12/14/2012, 11/23/2014   Influenza,inj,Quad PF,6+ Mos 12/01/2016, 11/26/2018   Influenza-Unspecified 11/28/2013, 11/14/2015, 12/04/2018, 01/15/2020, 01/15/2020   PFIZER(Purple Top)SARS-COV-2 Vaccination 05/09/2019, 05/30/2019, 01/15/2020   Pneumococcal Polysaccharide-23 11/25/2010   Tdap 02/25/2008, 03/30/2018   Zoster Recombinant(Shingrix) 08/16/2020, 08/21/2021   No results found.  Ophthalmology yearly, wears contact lenses. 3 months out from optho appt. Has some decreased near vision acuity with contacts. Using readers. Multifocal lenses.   Dental: every 6 months.   Alcohol: 5-7 per week.    Tobacco: none.   Exercise: 3-4 days per week, , no recent asthma issues.    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 Authorizing Provider  ASHWAGANDHA 35 PO Take by mouth.   Yes [provider]  finasteride (PROSCAR) 5 MG tablet TAKE 1/4 TABLET DAILY 08/21/21  Yes Shade Flood, MD  lisinopril (ZESTRIL) 5 MG tablet TAKE 1 TABLET(5 MG) BY MOUTH DAILY 12/13/21  Yes Shade Flood, MD  Magnesium Gluconate (MAGNESIUM 27 PO) Take by mouth.   Yes [provider]  Multiple Vitamin (MULTIVITAMIN) tablet Take 1 tablet by mouth daily.   Yes [provider]  Omega-3 Fatty Acids (FISH OIL) 300 MG CAPS Take by mouth.   Yes [provider]  Zinc 10 MG LOZG Use as directed in the mouth or throat.   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[employer], lawyer  Tobacco Use   Smoking status: Never   Smokeless tobacco: Never  Vaping Use   Vaping status: Never Used  Substance and Sexual Activity   Alcohol use: Yes    Alcohol/week: 5.0 standard drinks of alcohol    Types: 5 Standard drinks or equivalent per week    Comment: 5-7 weekly average   Drug use: No   Sexual activity: Yes  Other Topics Concern   Not on file  Social History Narrative    Married, 5 children   1 son, autism   1 daughter   Runs   Education: Automotive engineer   Exercise: Yes   Caffeine- 1 daily   Social Determinants of Corporate investment banker Strain: Not on file  Food Insecurity: Not on file  Transportation Needs: Not on file  Physical Activity: Not on file  Stress: Not on file  Social Connections: Not on file  Intimate Partner Violence: Not on file    Review of Systems 13 point review of systems per patient health survey noted.  Negative other than as indicated above or in HPI.    Objective:   Vitals:   09/24/22 1346  BP: 128/72  Pulse: (!) 56  Temp: 98.2 F (36.8 C)  TempSrc: Temporal  SpO2: 99%  Weight: 209 lb 3.2 oz (94.9 kg)  Height: 6\' 3"  (1.905 m)   {Vitals History (Optional):23777}  Physical Exam Vitals reviewed.  Constitutional:      Appearance: He is well-developed.  HENT:     Head: Normocephalic and atraumatic.     Right Ear: External ear normal.     Left Ear: External ear normal.  Eyes:     Conjunctiva/sclera: Conjunctivae normal.     Pupils: Pupils are equal, round, and reactive to light.  Neck:     Thyroid: No thyromegaly.  Cardiovascular:     Rate and Rhythm: Normal rate and regular rhythm.     Heart sounds: Normal heart sounds.  Pulmonary:     Effort: Pulmonary effort is normal. No respiratory distress.     Breath sounds: Normal breath sounds. No wheezing.  Abdominal:     General: There is no distension.     Palpations: Abdomen is soft.     Tenderness: There is no abdominal tenderness.  Musculoskeletal:        General: No tenderness. Normal range of motion.     Cervical back: Normal range of motion and neck supple.  Lymphadenopathy:     Cervical: No cervical adenopathy.  Skin:    General: Skin is warm and dry.  Neurological:     General: No focal deficit present.     Mental Status: He is alert and oriented to person, place, and time.     Cranial Nerves: No cranial nerve deficit, dysarthria or facial  asymmetry.     Sensory: Sensation is intact.     Motor: Tremor (faint bliaterral hands at rest and slight with finger to nose.) present. No weakness or pronator drift.     Deep Tendon Reflexes: Reflexes are normal and symmetric.  Psychiatric:        Behavior: Behavior normal.     Assessment & Plan:  SAKAI LEAZER is a 52 y.o. male . Fasciculations - Plan: Ambulatory referral to Neurology  Tremor of both hands - Plan: Ambulatory referral to Neurology  Screening for diabetes mellitus - Plan: Comprehensive metabolic panel, Hemoglobin A1c  Screening for hyperlipidemia - Plan: Comprehensive metabolic panel, Lipid panel  Screening, anemia, deficiency, iron - Plan: CBC  Screening for thyroid disorder - Plan: TSH  Male pattern baldness - Plan: finasteride (PROSCAR) 5 MG tablet  Essential hypertension - Plan: lisinopril (ZESTRIL) 5 MG tablet   Meds ordered this encounter  Medications   finasteride (PROSCAR) 5 MG tablet    Sig: TAKE 1/4 TABLET DAILY    Dispense:  90 tablet    Refill:  1   lisinopril (ZESTRIL) 5 MG tablet    Sig: TAKE 1 TABLET(5 MG) BY MOUTH DAILY    Dispense:  90 tablet    Refill:  3   Patient Instructions  I will check electrolytes today, stay hydrated. I will also refer you to neuro to discuss these symptoms, the hand tremor and an exam there as well to see if any other testing needed.  No med changes at this time.  Recheck in 1 year for physical if everything else is stable.  Let me know if you have questions.  Preventive Care 18-3 Years Old, Male Preventive care refers to lifestyle choices and visits with your health care provider that can promote health and wellness. Preventive care visits are also called wellness exams. What can  I expect for my preventive care visit? Counseling During your preventive care visit, your health care provider may ask about your: Medical history, including: Past medical problems. Family medical history. Current  health, including: Emotional well-being. Home life and relationship well-being. Sexual activity. Lifestyle, including: Alcohol, nicotine or tobacco, and drug use. Access to firearms. Diet, exercise, and sleep habits. Safety issues such as seatbelt and bike helmet use. Sunscreen use. Work and work Astronomer. Physical exam Your health care provider will check your: Height and weight. These may be used to calculate your BMI (body mass index). BMI is a measurement that tells if you are at a healthy weight. Waist circumference. This measures the distance around your waistline. This measurement also tells if you are at a healthy weight and may help predict your risk of certain diseases, such as type 2 diabetes and high blood pressure. Heart rate and blood pressure. Body temperature. Skin for abnormal spots. What immunizations do I need?  Vaccines are usually given at various ages, according to a schedule. Your health care provider will recommend vaccines for you based on your age, medical history, and lifestyle or other factors, such as travel or where you work. What tests do I need? Screening Your health care provider may recommend screening tests for certain conditions. This may include: Lipid and cholesterol levels. Diabetes screening. This is done by checking your blood sugar (glucose) after you have not eaten for a while (fasting). Hepatitis B test. Hepatitis C test. HIV (human immunodeficiency virus) test. STI (sexually transmitted infection) testing, if you are at risk. Lung cancer screening. Prostate cancer screening. Colorectal cancer screening. Talk with your health care provider about your test results, treatment options, and if necessary, the need for more tests. Follow these instructions at home: Eating and drinking  Eat a diet that includes fresh fruits and vegetables, whole grains, lean protein, and low-fat dairy products. Take vitamin and mineral supplements as  recommended by your health care provider. Do not drink alcohol if your health care provider tells you not to drink. If you drink alcohol: Limit how much you have to 0-2 drinks a day. Know how much alcohol is in your drink. In the [place] one drink equals one 12 oz bottle of beer (355 mL), one 5 oz glass of wine (148 mL), or one 1 oz glass of hard liquor (44 mL). Lifestyle Brush your teeth every morning and night with fluoride toothpaste. Floss one time each day. Exercise for at least 30 minutes 5 or more days each week. Do not use any products that contain nicotine or tobacco. These products include cigarettes, chewing tobacco, and vaping devices, such as e-cigarettes. If you need help quitting, ask your health care provider. Do not use drugs. If you are sexually active, practice safe sex. Use a condom or other form of protection to prevent STIs. Take aspirin only as told by your health care provider. Make sure that you understand how much to take and what form to take. Work with your health care provider to find out whether it is safe and beneficial for you to take aspirin daily. Find healthy ways to manage stress, such as: Meditation, yoga, or listening to music. Journaling. Talking to a trusted person. Spending time with friends and family. Minimize exposure to UV radiation to reduce your risk of skin cancer. Safety Always wear your seat belt while driving or riding in a vehicle. Do not drive: If you have been drinking alcohol. Do not ride with someone who has  been drinking. When you are tired or distracted. While texting. If you have been using any mind-altering substances or drugs. Wear a helmet and other protective equipment during sports activities. If you have firearms in your house, make sure you follow all gun safety procedures. What's next? Go to your health care provider once a year for an annual wellness visit. Ask your health care provider how often you should have your eyes  and teeth checked. Stay up to date on all vaccines. This information is not intended to replace advice given to you by your health care provider. Make sure you discuss any questions you have with your health care provider. Document Revised: 08/08/2020 Document Reviewed: 08/08/2020 Elsevier Patient Education  2024 [employer] Inc.       Signed,   Meredith Staggers, MD [facility], [facility] 09/24/22 2:34 PM

## 2022-09-24 NOTE — Patient Instructions (Addendum)
I will check electrolytes today, stay hydrated. I will also refer you to neuro to discuss these symptoms, the hand tremor and an exam there as well to see if any other testing needed.  No med changes at this time.  Recheck in 1 year for physical if everything else is stable.  Let me know if you have questions.  Preventive Care 72-53 Years Old, Male Preventive care refers to lifestyle choices and visits with your health care provider that can promote health and wellness. Preventive care visits are also called wellness exams. What can I expect for my preventive care visit? Counseling During your preventive care visit, your health care provider may ask about your: Medical history, including: Past medical problems. Family medical history. Current health, including: Emotional well-being. Home life and relationship well-being. Sexual activity. Lifestyle, including: Alcohol, nicotine or tobacco, and drug use. Access to firearms. Diet, exercise, and sleep habits. Safety issues such as seatbelt and bike helmet use. Sunscreen use. Work and work Astronomer. Physical exam Your health care provider will check your: Height and weight. These may be used to calculate your BMI (body mass index). BMI is a measurement that tells if you are at a healthy weight. Waist circumference. This measures the distance around your waistline. This measurement also tells if you are at a healthy weight and may help predict your risk of certain diseases, such as type 2 diabetes and high blood pressure. Heart rate and blood pressure. Body temperature. Skin for abnormal spots. What immunizations do I need?  Vaccines are usually given at various ages, according to a schedule. Your health care provider will recommend vaccines for you based on your age, medical history, and lifestyle or other factors, such as travel or where you work. What tests do I need? Screening Your health care provider may recommend screening  tests for certain conditions. This may include: Lipid and cholesterol levels. Diabetes screening. This is done by checking your blood sugar (glucose) after you have not eaten for a while (fasting). Hepatitis B test. Hepatitis C test. HIV (human immunodeficiency virus) test. STI (sexually transmitted infection) testing, if you are at risk. Lung cancer screening. Prostate cancer screening. Colorectal cancer screening. Talk with your health care provider about your test results, treatment options, and if necessary, the need for more tests. Follow these instructions at home: Eating and drinking  Eat a diet that includes fresh fruits and vegetables, whole grains, lean protein, and low-fat dairy products. Take vitamin and mineral supplements as recommended by your health care provider. Do not drink alcohol if your health care provider tells you not to drink. If you drink alcohol: Limit how much you have to 0-2 drinks a day. Know how much alcohol is in your drink. In the [place] one drink equals one 12 oz bottle of beer (355 mL), one 5 oz glass of wine (148 mL), or one 1 oz glass of hard liquor (44 mL). Lifestyle Brush your teeth every morning and night with fluoride toothpaste. Floss one time each day. Exercise for at least 30 minutes 5 or more days each week. Do not use any products that contain nicotine or tobacco. These products include cigarettes, chewing tobacco, and vaping devices, such as e-cigarettes. If you need help quitting, ask your health care provider. Do not use drugs. If you are sexually active, practice safe sex. Use a condom or other form of protection to prevent STIs. Take aspirin only as told by your health care provider. Make sure that you understand how  much to take and what form to take. Work with your health care provider to find out whether it is safe and beneficial for you to take aspirin daily. Find healthy ways to manage stress, such as: Meditation, yoga, or listening  to music. Journaling. Talking to a trusted person. Spending time with friends and family. Minimize exposure to UV radiation to reduce your risk of skin cancer. Safety Always wear your seat belt while driving or riding in a vehicle. Do not drive: If you have been drinking alcohol. Do not ride with someone who has been drinking. When you are tired or distracted. While texting. If you have been using any mind-altering substances or drugs. Wear a helmet and other protective equipment during sports activities. If you have firearms in your house, make sure you follow all gun safety procedures. What's next? Go to your health care provider once a year for an annual wellness visit. Ask your health care provider how often you should have your eyes and teeth checked. Stay up to date on all vaccines. This information is not intended to replace advice given to you by your health care provider. Make sure you discuss any questions you have with your health care provider. Document Revised: 08/08/2020 Document Reviewed: 08/08/2020 Elsevier Patient Education  2024 [employer].

## 2022-09-25 ENCOUNTER — Encounter: Payer: Self-pay | Admitting: Family Medicine

## 2022-09-29 ENCOUNTER — Encounter: Payer: Self-pay | Admitting: Neurology

## 2022-10-17 NOTE — Progress Notes (Signed)
Initial neurology clinic note  Reason for Evaluation: Consultation requested by Shade Flood, MD for an opinion regarding muscle twitching. My final recommendations will be communicated back to the requesting physician by way of shared medical record or letter to requesting physician via [place] mail.  HPI: This is Mr. Zachary Diaz, a 52 y.o. right-handed male with a medical history of HTN, HLD, GERD who presents to neurology clinic with the chief complaint of muscle twitching. The patient is alone today.  Patient has had a mild tremor in both hands since childhood. He has noticed in the last 1-1.5 years a lot more issues. He feels like he is carrying more stress. He lists multiple issues: He has noticed jumping of the left bicep and hamstrings (right > left). It is not quite like a charlie horse. He feels like his muscles are tight. This includes the right 4th/5th digit area that tightens. He thinks he has had some muscle loss in the arms. He is not sure he is weak though. He has been more tired over the last few months.  He has noticed difficulty with vision up close. This is corrected with readers but is inconsistent.  He does not feel mentally sharp. He has occasional word finding difficulty.  He endorses erectile dysfunction. He also endorses alternating constipation and diarrhea. He started a pro-biotic that has helped.  He has noticed some vertigo as well. He can correct it by laying down and turning head. He feels off physically over the last year. He feels imbalance and is dropping things.  The patient denies symptoms suggestive of oculobulbar weakness including diplopia, ptosis, dysphagia, poor saliva control, dysarthria/dysphonia, impaired mastication, facial weakness/droop.  He does not report any constitutional symptoms like fever, night sweats, anorexia or unintentional weight loss.  He denies neck or back pain.  Patient endorses poor sleep. He falls asleep easily.  He typically goes to bed 10pm-12am. He will wake up by 7am. He will wake up in the middle of the night and cannot sleep. He may snort occasionally, but does not think he snores. He can sleep through the night with CBD gummies. He feels rested when he wakes, but his energy level is low.  In terms of mood, he does not describe himself as sad. He finds it more that he does not enjoy things and is more apathetic. This has been going on for about 1-1.5 years as well (since his physical things).  Patient takes a multivitamin, magnesium gluconate, and seasonal zinc (takes to avoid colds - has been taking lately since 05/2022). He drinks one cup of coffee per day. He denies any energy drink usage.  EtOH use: social, maybe a few drinks per week  Restrictive diet? No Family history of neuropathy/myopathy/neurology disease? Not known  Of note, patient saw Dr. Marjory Lies in 2019 for left hemifacial spasm per chart review. When asked, patient said this just went away and has been gone for some time.   MEDICATIONS:  Outpatient Encounter Medications as of 10/29/2022  Medication Sig   acidophilus (RISAQUAD) CAPS capsule Take 1 capsule by mouth daily.   ASHWAGANDHA 35 PO Take by mouth.   finasteride (PROSCAR) 5 MG tablet TAKE 1/4 TABLET DAILY   lisinopril (ZESTRIL) 5 MG tablet TAKE 1 TABLET(5 MG) BY MOUTH DAILY   Magnesium Gluconate (MAGNESIUM 27 PO) Take 500 mg by mouth daily.   Multiple Vitamin (MULTIVITAMIN) tablet Take 1 tablet by mouth daily.   Omega-3 Fatty Acids (FISH OIL) 300 MG  CAPS Take by mouth.   tadalafil (CIALIS) 5 MG tablet Take 5 mg by mouth daily as needed for erectile dysfunction.   Zinc 10 MG LOZG Use as directed 50 mg in the mouth or throat daily.   No facility-administered encounter medications on file as of 10/29/2022.    PAST MEDICAL HISTORY: Past Medical History:  Diagnosis Date   Asthma    GERD (gastroesophageal reflux disease)    Hypertension     PAST SURGICAL HISTORY: Past  Surgical History:  Procedure Laterality Date   KNEE ARTHROSCOPY Left 2006   wisdom teeth      ALLERGIES: No Known Allergies  FAMILY HISTORY: Family History  Adopted: Yes  Problem Relation Age of Onset   COPD Mother    Hyperlipidemia Mother    Hypertension Mother    Mental retardation Mother    Mental illness Mother    Liver disease Sister        heavy drinker   Polycystic ovary syndrome Daughter    Bipolar disorder Daughter    Autism Son    Mental illness Maternal Grandmother    Colon cancer Neg Hx    Esophageal cancer Neg Hx    Rectal cancer Neg Hx    Stomach cancer Neg Hx     SOCIAL HISTORY: Social History   Tobacco Use   Smoking status: Never   Smokeless tobacco: Never  Vaping Use   Vaping status: Never Used  Substance Use Topics   Alcohol use: Yes    Alcohol/week: 5.0 standard drinks of alcohol    Types: 5 Standard drinks or equivalent per week    Comment: 5-7 weekly average   Drug use: No    Comment: cbd oil for sleep   Social History   Social History Narrative   Married, 5 children   1 son, autism   1 daughter   Runs   Education: Automotive engineer   Exercise: Yes   Caffeine- 1 am and 1 pm tea   Realtor Attorney      OBJECTIVE: PHYSICAL EXAM: BP (!) 147/96   Pulse (!) 57   Resp (!) 98   Ht 6\' 3"  (1.905 m)   Wt 207 lb (93.9 kg)   BMI 25.87 kg/m   General: General appearance: Awake and alert. No distress, anxious appearing. Cooperative with exam.  Skin: No obvious rash or jaundice. HEENT: Atraumatic. Anicteric. Frontal bossing. Lungs: Non-labored breathing on room air  Extremities: No edema. No obvious deformity.  Musculoskeletal: No obvious joint swelling. Psych: Anxious, mildly flat affect.  Neurological: Mental Status: Alert. Speech fluent. No pseudobulbar affect Cranial Nerves: CNII: No RAPD. Visual fields grossly intact. CNIII, IV, VI: PERRL. No nystagmus. EOMI. CN V: Facial sensation intact bilaterally to fine touch. [place]eter clench  strong. Jaw jerk negative. CN VII: Facial muscles symmetric and strong. No ptosis at rest. CN VIII: Hearing grossly intact bilaterally. CN IX: No hypophonia. CN X: Palate elevates symmetrically. CN XI: Full strength shoulder shrug bilaterally. CN XII: Tongue protrusion full and midline. No atrophy or fasciculations. No significant dysarthria Motor: Tone is normal. Mild tremor seen in bilateral hands. No fasciculations in extremities. No atrophy. No grip, eyelid, or percussive myotonia.  Individual muscle group testing (MRC grade out of 5):  Movement     Neck flexion 5    Neck extension 5     Right Left   Shoulder abduction 5 5   Shoulder adduction 5 5   Shoulder ext rotation 5 5   Shoulder int  rotation 5 5   Elbow flexion 5 5   Elbow extension 5 5   Wrist extension 5 5   Wrist flexion 5 5   Finger abduction - FDI 5 5   Finger abduction - ADM 5 5   Finger extension 5 5   Finger distal flexion - 2/3 5 5    Finger distal flexion - 4/5 5 5    Thumb flexion - FPL 5 5   Thumb abduction - APB 5 5    Hip flexion 5 5   Hip extension 5 5   Hip adduction 5 5   Hip abduction 5 5   Knee extension 5 5   Knee flexion 5 5   Dorsiflexion 5 5   Plantarflexion 5 5   Inversion 5 5   Eversion 5 5   Great toe extension 5 5   Great toe flexion 5 5     Reflexes:  Right Left   Bicep 2+ 2+   Tricep 2+ 2+   BrRad 2+ 2+   Knee 2+ 2+   Ankle 2+ 2+    Pathological Reflexes: Babinski: mute response bilaterally Hoffman: Positive on right, negative on left Troemner: Positive on right, negative on left Pectoral: absent bilaterally Facial: Absent bilaterally Midline tap: absent Sensation: Pinprick: Diminished in bilateral lateral aspects of forearm. Patchy loss in bilateral thighs. Vibration: Intact in all extremities Proprioception: Intact in bilateral great toes Coordination: Intact finger-to- nose-finger bilaterally. Romberg negative. Gait: Able to rise from chair with arms crossed  unassisted. Normal, narrow-based gait. Able to tandem walk. Able to walk on toes and heels.  Lab and Test Review: Internal labs: 09/24/22: HbA1c: 5.4 TSH wnl CBC unremarkable CMP unremarkable Lipid panel: Component     Latest Ref Rng 09/24/2022  Cholesterol     0 - 200 mg/dL 161 (H)   Triglycerides     0.0 - 149.0 mg/dL 09.6   HDL Cholesterol     >39.00 mg/dL 04.54   VLDL     0.0 - 40.0 mg/dL 09.8   LDL (calc)     0 - 99 mg/dL 119 (H)   Total CHOL/HDL Ratio 3   NonHDL 152.24     Imaging: CT head wo contrast (04/16/20): FINDINGS: Brain: No evidence of acute infarction, hemorrhage, hydrocephalus, extra-axial collection or mass lesion/mass effect.   Vascular: Negative for hyperdense vessel   Skull: Negative   Sinuses/Orbits: Paranasal sinuses clear.  Negative orbit   Other: None   IMPRESSION: Negative CT head  Cervical spine xray (04/16/20): FINDINGS: Frontal, lateral, open-mouth odontoid, and bilateral oblique views were obtained. There is no fracture or spondylolisthesis. Prevertebral soft tissues and predental space regions are normal. There is moderate disc space narrowing at C3-4. There is slight disc space narrowing at C6-7. Other disc spaces appear unremarkable. There is facet hypertrophy with a degree of exit foraminal narrowing at C3-4, C4-5, C5-6, and C6-7 bilaterally. Lung apices are clear.   IMPRESSION: Osteoarthritic change at several levels. Disc space narrowing is most notable at C3-4. No fracture or spondylolisthesis.  MRI brain w/wo contrast (10/21/17): FINDINGS:  The brain parenchyma shows no abnormal signal intensities. No structural lesion, tumor infarcts are noted. Thin sections through the brainstem demonstrate abnormal loop of blood vessel coursing from near the seventh eighth nerve complex ventrally on the left which may be impacting the left seventh and fifth cranial nerves.this is best seen on post contrast images only( axial T 1 contrast  Se 13 Image 18/30 and coronal T  1 Se 14 Image 13/21) .No abnormal lesions are seen on diffusion-weighted views to suggest acute ischemia. The cortical sulci, fissures and cisterns are normal in size and appearance. Lateral, third and fourth ventricle are normal in size and appearance. No extra-axial fluid collections are seen. No evidence of mass effect or midline shift.  No abnormal lesions are seen on post contrast views.  On sagittal views the posterior fossa, pituitary gland and corpus callosum are unremarkable. No evidence of intracranial hemorrhage on gradient-echo views. The orbits and their contents, paranasal sinuses and calvarium are unremarkable.  Intracranial flow voids are present.   IMPRESSION:  Equivocal MRI scan of the brain with thin sections through the brainstem showing abnormal enhancing blood vessel on the left of the  brainstem and and coursing ventrally and possibly impacting the left seventh and fifth cranial nerves No other significant abnormalities  Lumbar spine xray (07/27/13): FINDINGS:  There is no evidence of lumbar spine fracture. Alignment is normal.  Mild degenerative disc disease is noted at L4-5. Other disc spaces  appear well maintained.   IMPRESSION:  Mild degenerative disc disease is noted at L4-5. No acute  abnormality seen in the lumbar spine.   ASSESSMENT: Darek Oldt Peloquin is a 52 y.o. male who presents for evaluation of muscle twitching, low energy, vision changes, cognitive changes, imbalance, vertigo, bowel changes, and erectile dysfunction. He has a relevant medical history of HTN, HLD, GERD. His neurological examination is pertinent for patchy sensory loss in legs and in forearms. Available diagnostic data is significant for normal HbA1c and TSH. The etiology of patient's symptoms is currently unclear. His symptoms are currently difficult to localize and unclear if connected. There is no clear central or peripheral nervous system disorder currently. His  memory, digestive, and erectile dysfunction could be related to poor sleep and mood, but again, not clear. His tremor may be essential tremor. It is not currently bothering patient. The vertigo may be related to BPPV as it improves with head turning exercises usually.  I will check labs to look for treatable causes and get an EMG to further clarify muscle and sensory complaints.   PLAN: -Blood work: B1, B12, copper, zinc, vit D -EMG: Left arm and right leg (most symptomatic extremities) -OTC twitching recommendations given  -Return to clinic to be determined  The impression above as well as the plan as outlined below were extensively discussed with the patient who voiced understanding. All questions were answered to their satisfaction.  When available, results of the above investigations and possible further recommendations will be communicated to the patient via telephone/MyChart. Patient to call office if not contacted after expected testing turnaround time.   Total time spent reviewing records, interview, history/exam, documentation, and coordination of care on day of encounter:  65 min   Thank you for allowing me to participate in patient's care.  If I can answer any additional questions, I would be pleased to do so.  Jacquelyne Balint, MD   CC: Shade Flood, MD [address] [place] [address] [place] [address]  CC: Referring provider: Shade Flood, MD [address] [place] [address] [place],  [place] [address]

## 2022-10-29 ENCOUNTER — Other Ambulatory Visit (INDEPENDENT_AMBULATORY_CARE_PROVIDER_SITE_OTHER): Payer: 59

## 2022-10-29 ENCOUNTER — Ambulatory Visit (INDEPENDENT_AMBULATORY_CARE_PROVIDER_SITE_OTHER): Payer: 59 | Admitting: Neurology

## 2022-10-29 ENCOUNTER — Encounter: Payer: Self-pay | Admitting: Neurology

## 2022-10-29 VITALS — BP 128/98 | HR 57 | Ht 75.0 in | Wt 207.0 lb

## 2022-10-29 DIAGNOSIS — R2689 Other abnormalities of gait and mobility: Secondary | ICD-10-CM

## 2022-10-29 DIAGNOSIS — R4189 Other symptoms and signs involving cognitive functions and awareness: Secondary | ICD-10-CM

## 2022-10-29 DIAGNOSIS — R5383 Other fatigue: Secondary | ICD-10-CM

## 2022-10-29 DIAGNOSIS — H539 Unspecified visual disturbance: Secondary | ICD-10-CM

## 2022-10-29 DIAGNOSIS — R253 Fasciculation: Secondary | ICD-10-CM

## 2022-10-29 DIAGNOSIS — R42 Dizziness and giddiness: Secondary | ICD-10-CM | POA: Diagnosis not present

## 2022-10-29 LAB — VITAMIN B12: Vitamin B-12: 459 pg/mL (ref 211–911)

## 2022-10-29 LAB — VITAMIN D 25 HYDROXY (VIT D DEFICIENCY, FRACTURES): VITD: 30.19 ng/mL (ref 30.00–100.00)

## 2022-10-29 NOTE — Patient Instructions (Addendum)
I saw you today for muscle twitches, low energy, and overall feeling less well physically. I'm not sure the cause of your symptoms, but would like to investigate further with the following: -Blood work today -Muscle and nerve testing called EMG (see more information below)  I will be in touch when I have your results to discuss next steps.  - Recommend the following measures that may provide some symptomatic benefit for muscle twitching and/or cramps: - Adequate oral clear fluid intake to maintain optimal hydration (about 2.5 liters, or around 8-10 glasses per day) Avoidance of caffeine Trial of DIET tonic water: About 1 glass, up to 6 times daily Magnesium oxide up to 400 mg by mouth twice daily, as needed (over the counter) Gentle muscle stretching routine, especially before bedtime  Please let me know if you have any questions or concerns in the meantime.   The physicians and staff at [facility] are committed to providing excellent care. You may receive a survey requesting feedback about your experience at our office. We strive to receive "very good" responses to the survey questions. If you feel that your experience would prevent you from giving the office a "very good " response, please contact our office to try to remedy the situation. We may be reached at [phone]. Thank you for taking the time out of your busy day to complete the survey.  Jacquelyne Balint, MD [facility]  ELECTROMYOGRAM AND NERVE CONDUCTION STUDIES (EMG/NCS) INSTRUCTIONS  How to Prepare The neurologist conducting the EMG will need to know if you have certain medical conditions. Tell the neurologist and other EMG lab personnel if you: Have a pacemaker or any other electrical medical device Take blood-thinning medications Have hemophilia, a blood-clotting disorder that causes prolonged bleeding Bathing Take a shower or bath shortly before your exam in order to remove oils from your skin. Don't apply  lotions or creams before the exam.  What to Expect You'll likely be asked to change into a hospital gown for the procedure and lie down on an examination table. The following explanations can help you understand what will happen during the exam.  Electrodes. The neurologist or a technician places surface electrodes at various locations on your skin depending on where you're experiencing symptoms. Or the neurologist may insert needle electrodes at different sites depending on your symptoms.  Sensations. The electrodes will at times transmit a tiny electrical current that you may feel as a twinge or spasm. The needle electrode may cause discomfort or pain that usually ends shortly after the needle is removed. If you are concerned about discomfort or pain, you may want to talk to the neurologist about taking a short break during the exam.  Instructions. During the needle EMG, the neurologist will assess whether there is any spontaneous electrical activity when the muscle is at rest - activity that isn't present in healthy muscle tissue - and the degree of activity when you slightly contract the muscle.  He or she will give you instructions on resting and contracting a muscle at appropriate times. Depending on what muscles and nerves the neurologist is examining, he or she may ask you to change positions during the exam.  After your EMG You may experience some temporary, minor bruising where the needle electrode was inserted into your muscle. This bruising should fade within several days. If it persists, contact your primary care doctor.

## 2022-11-06 LAB — ZINC: Zinc: 181 ug/dL — ABNORMAL HIGH (ref 60–130)

## 2022-11-06 LAB — VITAMIN B1: Vitamin B1 (Thiamine): 51 nmol/L — ABNORMAL HIGH (ref 8–30)

## 2022-11-06 LAB — COPPER, SERUM: Copper: 77 ug/dL (ref 70–175)

## 2022-11-21 ENCOUNTER — Other Ambulatory Visit: Payer: Self-pay | Admitting: Family Medicine

## 2022-11-21 DIAGNOSIS — L649 Androgenic alopecia, unspecified: Secondary | ICD-10-CM

## 2022-11-24 ENCOUNTER — Ambulatory Visit: Payer: 59 | Admitting: Neurology

## 2022-11-24 DIAGNOSIS — R5383 Other fatigue: Secondary | ICD-10-CM

## 2022-11-24 DIAGNOSIS — H539 Unspecified visual disturbance: Secondary | ICD-10-CM

## 2022-11-24 DIAGNOSIS — R253 Fasciculation: Secondary | ICD-10-CM | POA: Diagnosis not present

## 2022-11-24 DIAGNOSIS — R4189 Other symptoms and signs involving cognitive functions and awareness: Secondary | ICD-10-CM

## 2022-11-24 DIAGNOSIS — R42 Dizziness and giddiness: Secondary | ICD-10-CM

## 2022-11-24 DIAGNOSIS — R2689 Other abnormalities of gait and mobility: Secondary | ICD-10-CM

## 2022-11-24 NOTE — Procedures (Signed)
[facility]  [address] [place], [address]  [place], [place] [address] Tel: [phone] Fax: [phone] Test Date:  11/24/2022  Patient: Zachary Diaz DOB: Jun 02, 1970 Physician: Jacquelyne Balint, MD  Sex: Male Height: 6\' 3"  Ref Phys: Jacquelyne Balint, MD  ID#: [id number]   Technician:    History: This is a 52 year old male with muscle twitching.  NCV & EMG Findings: Extensive electrodiagnostic evaluation of the right lower limb and left upper limb shows: Right sural, right superficial peroneal/fibular, left median, left ulnar, left radial, and left median-ulnar palmar sensory responses are within normal limits. Bilateral tibial (AH) motor responses show reduced amplitude (L3.8, R3.0 mV). Right peroneal/fibular (EDB), left median (APB), and left ulnar (ADM) motor responses are within normal limits. Bilateral H reflex latencies are mildly prolonged (36.1 ms bilaterally). There is no evidence of active or chronic motor axon loss changes affecting any of the tested muscles on needle examination. Motor unit configuration and recruitment pattern is within normal limits.  Impression: This study shows no significant abnormalities. Specifically: No electrodiagnostic evidence of a large fiber sensorimotor polyneuropathy. No electrodiagnostic evidence of a left cervical (C5-C8) or right lumbosacral (L3-S1) motor radiculopathy. No electrodiagnostic evidence of a left median mononeuropathy at or distal to the wrist (ie: carpal tunnel syndrome). Low amplitude tibial (AH) motor responses bilaterally are of unclear clinical significance given normal needle examination and may be technical in nature. Bilateral H reflex latencies are mildly prolonged, likely due to height of patient.    ___________________________ Jacquelyne Balint, MD    Nerve Conduction Studies Motor Nerve Results    Latency Amplitude F-Lat Segment Distance CV Comment  Site (ms) Norm (mV) Norm (ms)  (cm) (m/s) Norm   Right  Fibular (EDB) Motor  Ankle 4.4  < 6.0 3.0  > 2.5        Bel fib head 13.2 - 2.2 -  Bel fib head-Ankle 37 42  > 40   Pop fossa 15.3 - 1.93 -  Pop fossa-Bel fib head 10 48 -   Left Median (APB) Motor  Wrist 2.8  < 4.0 8.8  > 6.0        Elbow 8.8 - 8.4 -  Elbow-Wrist 34 57  > 50   Left Tibial (AH) Motor  Ankle 3.5  < 6.0 *3.8  > 4.0        Knee 14.8 - 1.90 -  Knee-Ankle 46 41  > 40   Right Tibial (AH) Motor  Ankle 3.7  < 6.0 *3.0  > 4.0        Knee 14.6 - 0.57 -  Knee-Ankle 48 44  > 40   Left Ulnar (ADM) Motor  Wrist 2.2  < 3.1 12.3  > 7.0        Bel elbow 7.0 - 11.6 -  Bel elbow-Wrist 26 54  > 50   Ab elbow 9.0 - 11.0 -  Ab elbow-Bel elbow 10 50 -    Sensory Sites    Neg Peak Lat Amplitude (O-P) Segment Distance Velocity Comment  Site (ms) Norm (V) Norm  (cm) (ms)   Left Median Sensory  Wrist-Dig II 3.2  < 3.6 16  > 15 Wrist-Dig II 13    Left Median-Ulnar Palmar Sensory       Median  Palm-Wrist 2.1  < 2.2 56  > 10 Palm-Wrist 8         Ulnar  Palm-Wrist 2.1  < 2.2 11  > 5 Palm-Wrist 8  Left Radial Sensory  Forearm-Wrist 2.4  < 2.7 18  > 14 Forearm-Wrist 10    Right Superficial Fibular Sensory  14 cm-Ankle 3.0  < 4.6 4  > 4 14 cm-Ankle 14    Right Sural Sensory  Calf-Lat mall 3.9  < 4.6 6  > 4 Calf-Lat mall 14    Left Ulnar Sensory  Wrist-Dig V 3.1  < 3.1 13  > 10 Wrist-Dig V 11     H-Reflex Results    M-Lat H Lat H Neg Amp H-M Lat  Site (ms) (ms) Norm (mV) (ms)  Left Tibial H-Reflex  Pop fossa 8.3 *36.1  < 35.0 5.2 27.8  Right Tibial H-Reflex  Pop fossa 7.6 *36.1  < 35.0 5.2 28.5   Inter-Nerve Comparisons   Nerve 1 Value 1 Nerve 2 Value 2 Parameter Result Normal  Sensory Sites  L Median Palm-Wrist 2.1 ms L Ulnar Palm-Wrist 2.1 ms Peak Lat Diff 0 ms <0.40   Electromyography   Side Muscle Ins.Act Fibs Fasc Recrt Amp Dur Poly Activation Comment  Left FDI Nml Nml Nml Nml Nml Nml Nml Nml N/A  Left EIP Nml Nml Nml Nml Nml Nml Nml Nml N/A  Left FPL Nml Nml Nml Nml  Nml Nml Nml Nml N/A  Left Pronator teres Nml Nml Nml Nml Nml Nml Nml Nml N/A  Left Biceps Nml Nml Nml Nml Nml Nml Nml Nml N/A  Left Triceps Nml Nml Nml Nml Nml Nml Nml Nml N/A  Left Deltoid Nml Nml Nml Nml Nml Nml Nml Nml N/A  Left C7 PSP Nml Nml Nml Nml Nml Nml Nml Nml N/A  Right Tib ant Nml Nml Nml Nml Nml Nml Nml Nml N/A  Right Gastroc MH Nml Nml Nml Nml Nml Nml Nml Nml N/A  Right FDL Nml Nml Nml Nml Nml Nml Nml Nml N/A  Right Rectus fem Nml Nml Nml Nml Nml Nml Nml Nml N/A  Right Biceps fem SH Nml Nml Nml Nml Nml Nml Nml Nml N/A  Right Gluteus med Nml Nml Nml Nml Nml Nml Nml Nml N/A  Right Lumbar PSP lower Nml Nml Nml Nml Nml Nml Nml Nml N/A      Waveforms:  Motor             Sensory               H-Reflex

## 2022-11-25 ENCOUNTER — Telehealth: Payer: Self-pay | Admitting: Neurology

## 2022-11-25 NOTE — Telephone Encounter (Signed)
Called patient to discuss the results of patient's EMG. Accounting for patient's height, there were no significant abnormalities. I explained that I did not have a neurologic explanation for patient's symptoms currently. He will reach out if symptoms change, worsen, or persist.  All questions were answered.  Jacquelyne Balint, MD [facility]

## 2023-05-14 ENCOUNTER — Encounter: Payer: Self-pay | Admitting: Family Medicine

## 2023-05-14 ENCOUNTER — Telehealth: Admitting: Family Medicine

## 2023-05-14 DIAGNOSIS — K589 Irritable bowel syndrome without diarrhea: Secondary | ICD-10-CM

## 2023-05-14 DIAGNOSIS — R198 Other specified symptoms and signs involving the digestive system and abdomen: Secondary | ICD-10-CM

## 2023-05-14 DIAGNOSIS — R1011 Right upper quadrant pain: Secondary | ICD-10-CM

## 2023-05-14 NOTE — Progress Notes (Signed)
 Virtual Visit via Video Note  I connected with Zachary Diaz on 05/14/23 at 4:44 PM by a video enabled telemedicine application and verified that I am speaking with the correct person using two identifiers.  Patient location: home, by self.  My location: office - [place].    I discussed the limitations, risks, security and privacy concerns of performing an evaluation and management service by telephone and the availability of in person appointments. I also discussed with the patient that there may be a patient responsible charge related to this service. The patient expressed understanding and agreed to proceed, consent obtained  Chief complaint:  Chief Complaint  Patient presents with   Abdominal Pain    Pt notes pressure, and abnormal feeling in his lower Right quadrant of abdomen, and notes has been having this issue since last first, abnormal BM's, pt notes been taking miralax most dates to help with constipation     History of Present Illness: Zachary Diaz is a 53 y.o. male  Abdominal pain: Some ongoing symptoms. RUQ of abdomen. Past year.  Feels like food is stuck. Variable timing. Sometimes after meals, then can last for hours.  Still some bloating, not feeling like able to pass flatulence without having a bowel movement. Liquid BM's in am, sometimes constipation - no large stools, variable consistency of stools - thin snake-like stools, sometimes harder stools. Sometimes mucus in stool. Stools usually float. No melena/hematochezia.  Uses miralax per day - most days- this has helped with constipation. No current fiber supplement - fiber in diet.  Eval by GI in 2023. Diagnosed with IBS at that time.  Treated with Flagyl, appears that Xifaxan was not covered.  Prior fecal urgency - that resolved.  Dicyclomine also rx - 10mg  QID prn pain/cramping - did not take. Deferred EGD at that time.  No fever. No recent change in abdominal symptoms.  No recent GI eval.   No unexplained weight loss. No night sweats.     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 Authorizing Provider  finasteride (PROSCAR) 5 MG tablet TAKE 1/4 TABLET DAILY 11/21/22  Yes Shade Flood, MD  lisinopril (ZESTRIL) 5 MG tablet TAKE 1 TABLET(5 MG) BY MOUTH DAILY 09/24/22  Yes Shade Flood, MD  Magnesium Gluconate (MAGNESIUM 27 PO) Take 500 mg by mouth daily.   Yes [provider]  Multiple Vitamin (MULTIVITAMIN) tablet Take 1 tablet by mouth daily.   Yes [provider]  tadalafil (CIALIS) 5 MG tablet Take 5 mg by mouth daily as needed for erectile dysfunction.   Yes [provider]  Zinc 10 MG LOZG Use as directed 50 mg in the mouth or throat daily.   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[employer], lawyer  Tobacco Use   Smoking status: Never   Smokeless tobacco: Never  Vaping Use   Vaping status: Never Used  Substance and Sexual Activity   Alcohol use: Yes    Alcohol/week: 5.0 standard drinks of alcohol    Types: 5 Standard drinks or equivalent per week    Comment: 5-7 weekly average   Drug use: No    Comment: cbd oil for sleep   Sexual activity: Yes  Other Topics Concern   Not on file  Social History Narrative   Married,  5 children   1 son, autism   1 daughter   Runs   Education: College   Exercise: Yes   Caffeine- 1 am and 1 pm tea   Scientist, clinical (histocompatibility and immunogenetics)    Social Drivers of Corporate investment banker Strain: Not on file  Food Insecurity: Not on file  Transportation Needs: Not on file  Physical Activity: Not on file  Stress: Not on file  Social Connections: Not on file  Intimate Partner Violence: Not on file    Observations/Objective: There were no vitals filed for this visit. Speaking in full sentences, no respiratory distress.  Appropriate responses.  Nontoxic appearance.   Assessment and Plan: RUQ abdominal pain  Irregular bowel habits  Irritable bowel syndrome, unspecified type  Persistent right upper quadrant fullness sensation, persistent symptoms, intermittent based on meals at times, variable presentation.  May last for hours as above.  Denies acute or worsening symptoms, or significant change in symptoms.  Similar discussion at his gastroenterology visit in 2023, prior fecal urgency has improved since use of metronidazole, has not used dicyclomine.  On GI note review, similar bowel issues at that time as well including variable stools, thin stools.  I do suspect a component of irritable bowel syndrome, but they did note on his last GI visit possible need for EGD.  Refer back to gastroenterology, has dicyclomine if needed.  Can send a new prescription if that is expired.  MiraLAX if needed for constipation but may be worth a trial of fiber supplement daily like Citrucel, fiber in the diet, then MiraLAX if needed for constipation.  Denies red flag symptoms or acute change in symptoms at this time, will defer further imaging or testing until evaluation by GI unless new or worsening symptoms and he will let me know.  Follow Up Instructions: As needed   I discussed the assessment and treatment plan with the patient. The patient was provided an opportunity to ask questions and all were  answered. The patient agreed with the plan and demonstrated an understanding of the instructions.   The patient was advised to call back or seek an in-person evaluation if the symptoms worsen or if the condition fails to improve as anticipated.   Shade Flood, MD

## 2023-05-14 NOTE — Patient Instructions (Addendum)
 Thanks for taking the video call today. Sorry you are still dealing with the abdominal symptoms.  There still could be a component of irritable bowel syndrome.  Looking back at the note with gastroenterology in 2023, it appears you are having some similar symptoms at that time.  Sounds like the meds at that time helped somewhat with the fecal urgency but with the persistent upper abdominal symptoms and stool issues it may be worth following up with gastroenterology, they may recommend endoscopy (EGD).  You can try a fiber supplement like Citrucel, slowly add that to the diet as too much can cause worsening of bowel movements.  Okay to use MiraLAX if you are having constipation.  Make sure to drink plenty of fluids with the fiber in the diet.  I will refer you back to gastroenterology, but if anything changes with your symptoms or new symptoms let me know and we certainly can look at other studies or treatments in the interim.  Take care  Dr. Neva Seat

## 2023-07-05 NOTE — Progress Notes (Deleted)
 se     Zachary Canard, PA-C [address] [place], [place]  [address] Phone: [phone]   Primary Care Physician: Zachary Bras, MD  Primary Gastroenterologist:  Zachary Canard, PA-C / Zachary Johnson, MD   Chief Complaint:  Followup IBS       HPI:   Zachary Diaz is a 53 y.o. male Returns for f/u IBS.  Current Symptoms: Continues to have intermittent episodes of RUQ pain.  Feels like food is stuck.  Has abdominal bloating, diarrhea alternating with constipation.  Irregular bowel habits.  Occasional mucus in stool.  Denies melena or hematochezia.  Uses MiraLAX which helps constipation.  No fiber supplement.  Takes magnesium 500 Mg daily.  Was treated with Flagyl  for IBS flare in 2023.  Xifaxan  was cost prohibitive.  Was also given dicyclomine  10 Mg 4 times daily as needed, however not taking.  02/2019 RUQ ultrasound: Normal.  No gallstones.  Mild hepatic steatosis.  Normal CBD 4.4 mm.  05/2019 abdominal/pelvic CT with contrast: Normal.  No etiology for abdominal pain.  01/2020 Colonoscopy by Dr. General Zachary Diaz: 3 tubular adenomatous and 3 hyperplastic polyps removed.  Tortuous Colon.  Diverticulosis.  Internal hemorrhoids.  5 years repeat.   01/2019 abdominal x-ray (to evaluate RUQ pain): Moderate stool throughout the colon consistent with constipation.  No previous EGD or HIDA scan.  No recent labs since 10/2022.  Current Outpatient Medications  Medication Sig Dispense Refill   finasteride  (PROSCAR ) 5 MG tablet TAKE 1/4 TABLET DAILY 8 tablet 16   lisinopril  (ZESTRIL ) 5 MG tablet TAKE 1 TABLET(5 MG) BY MOUTH DAILY 90 tablet 3   Magnesium Gluconate (MAGNESIUM 27 PO) Take 500 mg by mouth daily.     Multiple Vitamin (MULTIVITAMIN) tablet Take 1 tablet by mouth daily.     tadalafil (CIALIS) 5 MG tablet Take 5 mg by mouth daily as needed for erectile dysfunction.     Zinc  10 MG LOZG Use as directed 50 mg in the mouth or throat daily.     No current facility-administered  medications for this visit.    Allergies as of 07/06/2023   (No Known Allergies)    Past Medical History:  Diagnosis Date   Asthma    GERD (gastroesophageal reflux disease)    Hypertension     Past Surgical History:  Procedure Laterality Date   KNEE ARTHROSCOPY Left 2006   wisdom teeth      Review of Systems:    All systems reviewed and negative except where noted in HPI.    Physical Exam:  There were no vitals taken for this visit. No LMP for male patient.  General: Well-nourished, well-developed in no acute distress.  Lungs: Clear to auscultation bilaterally. Non-labored. Heart: Regular rate and rhythm, no murmurs rubs or gallops.  Abdomen: Bowel sounds are normal; Abdomen is Soft; No hepatosplenomegaly, masses or hernias;  No Abdominal Tenderness; No guarding or rebound tenderness. Neuro: Alert and oriented x 3.  Grossly intact.  Psych: Alert and cooperative, normal mood and affect.   Imaging Studies: No results found.  Labs: CBC    Component Value Date/Time   WBC 6.6 09/24/2022 1442   RBC 5.14 09/24/2022 1442   HGB 14.6 09/24/2022 1442   HCT 45.0 09/24/2022 1442   PLT 247.0 09/24/2022 1442   MCV 87.4 09/24/2022 1442   MCV 83.3 02/09/2019 1438   MCH 28.3 02/09/2019 1438   MCH 28.0 08/30/2015 1200   MCHC 32.4 09/24/2022 1442   RDW 13.9 09/24/2022 1442  LYMPHSABS 1.9 08/16/2020 1528   MONOABS 0.4 08/16/2020 1528   EOSABS 0.1 08/16/2020 1528   BASOSABS 0.1 08/16/2020 1528    CMP     Component Value Date/Time   NA 138 09/24/2022 1442   NA 138 06/02/2019 1536   K 4.6 09/24/2022 1442   CL 101 09/24/2022 1442   CO2 31 09/24/2022 1442   GLUCOSE 78 09/24/2022 1442   BUN 14 09/24/2022 1442   BUN 11 06/02/2019 1536   CREATININE 1.00 09/24/2022 1442   CREATININE 0.97 08/30/2015 1200   CALCIUM 9.8 09/24/2022 1442   PROT 7.4 09/24/2022 1442   PROT 6.8 06/02/2019 1536   ALBUMIN 4.8 09/24/2022 1442   ALBUMIN 4.6 06/02/2019 1536   AST 17 09/24/2022  1442   ALT 13 09/24/2022 1442   ALKPHOS 40 09/24/2022 1442   BILITOT 0.9 09/24/2022 1442   BILITOT 0.3 06/02/2019 1536   GFRNONAA 88 06/02/2019 1536   GFRNONAA >89 08/30/2015 1200   GFRAA 102 06/02/2019 1536   GFRAA >89 08/30/2015 1200       Assessment and Plan:   Zachary Diaz is a 53 y.o. y/o male   1.  RUQ pain - HIDA scan? - EGD? - Labs?  CBC, CMP, lipase  2.  Irritable bowel syndrome, constipation predominant - Try Linzess - Abdominal x-ray to evaluate stool burden    Zachary Canard, PA-C  Follow up ***

## 2023-07-06 ENCOUNTER — Ambulatory Visit: Admitting: Physician Assistant

## 2023-07-06 ENCOUNTER — Encounter: Payer: Self-pay | Admitting: Family Medicine

## 2023-07-06 DIAGNOSIS — R1011 Right upper quadrant pain: Secondary | ICD-10-CM

## 2023-09-25 ENCOUNTER — Encounter: Payer: Self-pay | Admitting: Family Medicine

## 2023-09-29 ENCOUNTER — Other Ambulatory Visit: Payer: Self-pay | Admitting: Family Medicine

## 2023-09-29 DIAGNOSIS — I10 Essential (primary) hypertension: Secondary | ICD-10-CM

## 2023-10-07 ENCOUNTER — Encounter: Payer: Self-pay | Admitting: Family Medicine

## 2024-03-11 ENCOUNTER — Telehealth: Payer: Self-pay

## 2024-03-11 NOTE — Telephone Encounter (Signed)
 Copied from CRM [id number]. Topic: Clinical - Request for Lab/Test Order >> Mar 11, 2024  9:37 AM Deaijah H wrote: Reason for CRM: Patient called in due to doing International travel in march and would like to know what vaccines are needed. Please call.   Are any vaccines needed/recommended?

## 2024-03-11 NOTE — Telephone Encounter (Signed)
 He has an appointment with me on the 19th, can discuss further at that time as I typically like to review recommendations with patient at a visit.  In the meantime he can go to the [facility] website for information regarding travel to the particular area he will be visiting.  It is helpful if he reviews that prior to our visit.  Thanks

## 2024-03-11 NOTE — Telephone Encounter (Signed)
 Called patient and he said he has searched CDC wesbite. He will discuss at his visit

## 2024-03-14 ENCOUNTER — Ambulatory Visit: Admitting: Family Medicine

## 2024-03-14 ENCOUNTER — Encounter: Payer: Self-pay | Admitting: Family Medicine

## 2024-03-14 VITALS — BP 122/88 | HR 61 | Temp 98.3°F | Resp 18 | Ht 75.0 in | Wt 220.0 lb

## 2024-03-14 DIAGNOSIS — Z23 Encounter for immunization: Secondary | ICD-10-CM

## 2024-03-14 DIAGNOSIS — I10 Essential (primary) hypertension: Secondary | ICD-10-CM

## 2024-03-14 DIAGNOSIS — Z1329 Encounter for screening for other suspected endocrine disorder: Secondary | ICD-10-CM | POA: Diagnosis not present

## 2024-03-14 DIAGNOSIS — L649 Androgenic alopecia, unspecified: Secondary | ICD-10-CM | POA: Diagnosis not present

## 2024-03-14 DIAGNOSIS — Z13220 Encounter for screening for lipoid disorders: Secondary | ICD-10-CM | POA: Diagnosis not present

## 2024-03-14 DIAGNOSIS — Z130 Encounter for screening for diseases of the blood and blood-forming organs and certain disorders involving the immune mechanism: Secondary | ICD-10-CM | POA: Diagnosis not present

## 2024-03-14 DIAGNOSIS — Z131 Encounter for screening for diabetes mellitus: Secondary | ICD-10-CM | POA: Diagnosis not present

## 2024-03-14 DIAGNOSIS — Z0000 Encounter for general adult medical examination without abnormal findings: Secondary | ICD-10-CM

## 2024-03-14 LAB — LIPID PANEL
Cholesterol: 200 mg/dL (ref 28–200)
HDL: 58.1 mg/dL
LDL Cholesterol: 125 mg/dL — ABNORMAL HIGH (ref 10–99)
NonHDL: 141.87
Total CHOL/HDL Ratio: 3
Triglycerides: 86 mg/dL (ref 10.0–149.0)
VLDL: 17.2 mg/dL (ref 0.0–40.0)

## 2024-03-14 LAB — CBC
HCT: 46.2 % (ref 39.0–52.0)
Hemoglobin: 15.3 g/dL (ref 13.0–17.0)
MCHC: 33.1 g/dL (ref 30.0–36.0)
MCV: 85.9 fl (ref 78.0–100.0)
Platelets: 241 K/uL (ref 150.0–400.0)
RBC: 5.38 Mil/uL (ref 4.22–5.81)
RDW: 14 % (ref 11.5–15.5)
WBC: 5.7 K/uL (ref 4.0–10.5)

## 2024-03-14 LAB — COMPREHENSIVE METABOLIC PANEL WITH GFR
ALT: 21 U/L (ref 3–53)
AST: 30 U/L (ref 5–37)
Albumin: 4.5 g/dL (ref 3.5–5.2)
Alkaline Phosphatase: 43 U/L (ref 39–117)
BUN: 14 mg/dL (ref 6–23)
CO2: 32 meq/L (ref 19–32)
Calcium: 9.6 mg/dL (ref 8.4–10.5)
Chloride: 103 meq/L (ref 96–112)
Creatinine, Ser: 1.23 mg/dL (ref 0.40–1.50)
GFR: 66.31 mL/min
Glucose, Bld: 83 mg/dL (ref 70–99)
Potassium: 5.2 meq/L — ABNORMAL HIGH (ref 3.5–5.1)
Sodium: 141 meq/L (ref 135–145)
Total Bilirubin: 0.7 mg/dL (ref 0.2–1.2)
Total Protein: 6.6 g/dL (ref 6.0–8.3)

## 2024-03-14 LAB — HEMOGLOBIN A1C: Hgb A1c MFr Bld: 5.5 % (ref 4.6–6.5)

## 2024-03-14 LAB — TSH: TSH: 1.65 u[IU]/mL (ref 0.35–5.50)

## 2024-03-14 MED ORDER — FINASTERIDE 5 MG PO TABS: ORAL_TABLET | ORAL | 11 refills | Status: AC

## 2024-03-14 MED ORDER — LISINOPRIL 5 MG PO TABS: 5.0000 mg | ORAL_TABLET | Freq: Every day | ORAL | 11 refills | Status: AC

## 2024-03-14 NOTE — Progress Notes (Unsigned)
 "  Subjective:  Patient ID: Zachary Diaz, male    DOB: 03/04/1970  Age: 54 y.o. MRN: [id number]  CC:  Chief Complaint  Patient presents with   Annual Exam    Patient is traveling out of country. Wants to know of any vaccines he needs to get. Wants to ask about diamox for altitude sickness     HPI Zachary Diaz presents for Annual Exam As well as concerns as above with travel out of country Last physical with me in July 2024. Insurance issue last year.   PCP, me Neuro, Dr. Leigh, evaluated for muscle twitching in 2024.  Hypertension: Lisinopril  5 mg daily, overdue for follow-up/labs.  No new side effects on meds.  Home readings: none.  BP Readings from Last 3 Encounters:  03/14/24 122/88  10/29/22 (!) 128/98  09/24/22 128/72   Lab Results  Component Value Date   CREATININE 1.00 09/24/2022   Male pattern baldness Finasteride  1/4 tablet/day - doing ok.   Traveling to [place] - 2 weeks. Leaving March 6th? Will be climbing to [place], hiking with ascent and descent, planned acclimatization, but considering Diamox. Separate visit planned to discuss in further detail and vaccines.      03/14/2024   10:56 AM 09/24/2022    1:49 PM 08/21/2021    8:00 AM 08/16/2020    2:00 PM 04/19/2020    3:17 PM  Depression screen PHQ 2/9  Decreased Interest 0 0 0 0 0  Down, Depressed, Hopeless 0 0 0 0 0  PHQ - 2 Score 0 0 0 0 0  Altered sleeping 0 0 1 0   Tired, decreased energy 0 1 1 0   Change in appetite 0 0 0 0   Feeling bad or failure about yourself  0 0 0 0   Trouble concentrating 0 0 0 0   Moving slowly or fidgety/restless 0 0 0 0   Suicidal thoughts 0 0 0 0   PHQ-9 Score 0 1  2  0    Difficult doing work/chores Not difficult at all         Data saved with a previous flowsheet row definition    Health Maintenance  Topic Date Due   Hepatitis B Vaccines 19-59 Average Risk (1 of 3 - 19+ 3-dose series) Never done   Pneumococcal Vaccine: 50+ Years (2 of 2 - PCV)  11/25/2011   Influenza Vaccine  09/25/2023   COVID-19 Vaccine (4 - 2025-26 season) 10/26/2023   Colonoscopy  01/29/2025   DTaP/Tdap/Td (3 - Td or Tdap) 03/30/2028   HPV VACCINES (No Doses Required) Completed   Hepatitis C Screening  Completed   HIV Screening  Completed   Zoster Vaccines- Shingrix   Completed   Meningococcal B Vaccine  Aged Out  Colonoscopy 01/2020 - planned repeat 5 years.  Prostate: does not have family history of prostate cancer - unknown.  The natural history of prostate cancer and ongoing controversy regarding screening and potential treatment outcomes of prostate cancer has been discussed with the patient. The meaning of a false positive PSA and a false negative PSA has been discussed. He indicates understanding of the limitations of this screening test and wishes to proceed with screening PSA testing. Lab Results  Component Value Date   PSA1 <0.1 03/30/2018   PSA 0.10 08/21/2021   PSA 0.06 (L) 08/16/2020    Immunization History  Administered Date(s) Administered   Influenza Inj Mdck Quad Pf 12/28/2017   Influenza Split 12/23/2011, 12/14/2012, 11/23/2014  Influenza,inj,Quad PF,6+ Mos 12/01/2016, 11/26/2018   Influenza-Unspecified 11/28/2013, 11/14/2015, 12/04/2018, 01/15/2020, 01/15/2020   PFIZER(Purple Top)SARS-COV-2 Vaccination 05/09/2019, 05/30/2019, 01/15/2020   Pneumococcal Polysaccharide-23 11/25/2010   Tdap 02/25/2008, 03/30/2018   Zoster Recombinant(Shingrix ) 08/16/2020, 08/21/2021  Prevnar 20 today.  Flu vaccine today.  Recommended covid booster.   No results found. Optho - in past year.   Dental: recent visit - every 6 months.   Alcohol: rare - 3-5 per week.   Tobacco: none  Exercise: 5 days per week, cardio and weights, 15hrs/week.                   History Patient Active Problem List   Diagnosis Date Noted   RUQ pain 04/16/2021   Male pattern baldness 07/14/2012   Essential hypertension 08/04/2007   EXERCISE INDUCED ASTHMA  08/04/2007   Asthma 08/04/2007   Past Medical History:  Diagnosis Date   Asthma    GERD (gastroesophageal reflux disease)    Hypertension    Past Surgical History:  Procedure Laterality Date   KNEE ARTHROSCOPY Left 2006   wisdom teeth     Allergies[1] Prior to Admission medications  Medication Sig Start Date End Date Taking? Authorizing Provider  finasteride  (PROSCAR ) 5 MG tablet TAKE 1/4 TABLET DAILY 11/21/22  Yes Levora Reyes SAUNDERS, MD  lisinopril  (ZESTRIL ) 5 MG tablet TAKE 1 TABLET BY MOUTH EVERY DAY 09/29/23  Yes Levora Reyes SAUNDERS, MD  Multiple Vitamin (MULTIVITAMIN) tablet Take 1 tablet by mouth daily.   Yes [provider]  tadalafil (CIALIS) 5 MG tablet Take 5 mg by mouth daily as needed for erectile dysfunction.   Yes [provider]  Magnesium Gluconate (MAGNESIUM 27 PO) Take 500 mg by mouth daily. Patient not taking: Reported on 03/14/2024    [provider]  Zinc  10 MG LOZG Use as directed 50 mg in the mouth or throat daily. 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[employer], lawyer  Tobacco Use   Smoking status: Never   Smokeless tobacco: Never  Vaping Use   Vaping status: Never Used  Substance and Sexual Activity   Alcohol use: Yes    Alcohol/week: 5.0 standard drinks of alcohol    Types: 5 Standard drinks or equivalent per week    Comment: 5-7 weekly average   Drug use: No    Comment: cbd oil for sleep   Sexual activity: Yes  Other Topics Concern   Not on file  Social History Narrative   Married, 5 children   1 son, autism   1 daughter   Runs   Education: College   Exercise: Yes   Caffeine- 1 am and 1 pm tea   Realtor Attorney    Social  Drivers of Health   Tobacco Use: Low Risk (03/14/2024)   Patient History    Smoking Tobacco Use: Never    Smokeless Tobacco Use: Never    Passive Exposure: Not on file  Financial Resource Strain: Not on file  Food Insecurity: Not on file  Transportation Needs: Not on file  Physical Activity: Not on file  Stress: Not on file  Social Connections: Not on file  Intimate Partner Violence: Not  on file  Depression (PHQ2-9): Low Risk (03/14/2024)   Depression (PHQ2-9)    PHQ-2 Score: 0  Alcohol Screen: Not on file  Housing: Not on file  Utilities: Not on file  Health Literacy: Not on file    Review of Systems 13 point review of systems per patient health survey noted.  Negative other than as indicated above or in HPI.    Objective:   Vitals:   03/14/24 1049  BP: 122/88  Pulse: 61  Resp: 18  Temp: 98.3 F (36.8 C)  TempSrc: Temporal  SpO2: 98%  Weight: 220 lb (99.8 kg)  Height: 6' 3 (1.905 m)   {Vitals History (Optional):23777}  Physical Exam Vitals reviewed.  Constitutional:      Appearance: He is well-developed.  HENT:     Head: Normocephalic and atraumatic.     Right Ear: External ear normal.     Left Ear: External ear normal.  Eyes:     Conjunctiva/sclera: Conjunctivae normal.     Pupils: Pupils are equal, round, and reactive to light.  Neck:     Thyroid : No thyromegaly.  Cardiovascular:     Rate and Rhythm: Normal rate and regular rhythm.     Heart sounds: Normal heart sounds.  Pulmonary:     Effort: Pulmonary effort is normal. No respiratory distress.     Breath sounds: Normal breath sounds. No wheezing.  Abdominal:     General: There is no distension.     Palpations: Abdomen is soft.     Tenderness: There is no abdominal tenderness.  Musculoskeletal:        General: No tenderness. Normal range of motion.     Cervical back: Normal range of motion and neck supple.  Lymphadenopathy:     Cervical: No cervical adenopathy.  Skin:    General: Skin is warm  and dry.  Neurological:     Mental Status: He is alert and oriented to person, place, and time.     Deep Tendon Reflexes: Reflexes are normal and symmetric.  Psychiatric:        Behavior: Behavior normal.        Assessment & Plan:  Zachary Diaz is a 55 y.o. male . Annual physical exam  Needs flu shot - Plan: Flu vaccine trivalent PF, 6mos and older(Flulaval,Afluria,Fluarix,Fluzone)  Need for pneumococcal vaccination - Plan: Pneumococcal conjugate vaccine 20-valent (Prevnar 20)  Screening for hyperlipidemia - Plan: Comprehensive metabolic panel with GFR, Lipid panel  Screening for diabetes mellitus - Plan: Hemoglobin A1c  Screening, anemia, deficiency, iron - Plan: CBC  Screening for thyroid  disorder - Plan: TSH  Male pattern baldness - Plan: finasteride  (PROSCAR ) 5 MG tablet  Essential hypertension - Plan: Comprehensive metabolic panel with GFR, lisinopril  (ZESTRIL ) 5 MG tablet   Meds ordered this encounter  Medications   finasteride  (PROSCAR ) 5 MG tablet    Sig: TAKE 1/4 TABLET DAILY    Dispense:  8 tablet    Refill:  11   lisinopril  (ZESTRIL ) 5 MG tablet    Sig: Take 1 tablet (5 mg total) by mouth daily.    Dispense:  30 tablet    Refill:  11   Patient Instructions   Thank you for coming in today. No change in medications at this time, but keep an eye on blood pressure - goal of 130/80 or below. If there are any concerns on your bloodwork, I will let you know. Take care!   Lets follow-up with a virtual visit in the next  week or 2 and we can review the current vaccines, plans for upcoming travel and discuss Diamox further at that time.  Appreciate you scheduling a separate visit.   Preventive Care 27-63 Years Old, Male Preventive care refers to lifestyle choices and visits with your health care provider that can promote health and wellness. Preventive care visits are also called wellness exams. What can I expect for my preventive care  visit? Counseling During your preventive care visit, your health care provider may ask about your: Medical history, including: Past medical problems. Family medical history. Current health, including: Emotional well-being. Home life and relationship well-being. Sexual activity. Lifestyle, including: Alcohol, nicotine or tobacco, and drug use. Access to firearms. Diet, exercise, and sleep habits. Safety issues such as seatbelt and bike helmet use. Sunscreen use. Work and work astronomer. Physical exam Your health care provider will check your: Height and weight. These may be used to calculate your BMI (body mass index). BMI is a measurement that tells if you are at a healthy weight. Waist circumference. This measures the distance around your waistline. This measurement also tells if you are at a healthy weight and may help predict your risk of certain diseases, such as type 2 diabetes and high blood pressure. Heart rate and blood pressure. Body temperature. Skin for abnormal spots. What immunizations do I need?  Vaccines are usually given at various ages, according to a schedule. Your health care provider will recommend vaccines for you based on your age, medical history, and lifestyle or other factors, such as travel or where you work. What tests do I need? Screening Your health care provider may recommend screening tests for certain conditions. This may include: Lipid and cholesterol levels. Diabetes screening. This is done by checking your blood sugar (glucose) after you have not eaten for a while (fasting). Hepatitis B test. Hepatitis C test. HIV (human immunodeficiency virus) test. STI (sexually transmitted infection) testing, if you are at risk. Lung cancer screening. Prostate cancer screening. Colorectal cancer screening. Talk with your health care provider about your test results, treatment options, and if necessary, the need for more tests. Follow these instructions  at home: Eating and drinking  Eat a diet that includes fresh fruits and vegetables, whole grains, lean protein, and low-fat dairy products. Take vitamin and mineral supplements as recommended by your health care provider. Do not drink alcohol if your health care provider tells you not to drink. If you drink alcohol: Limit how much you have to 0-2 drinks a day. Know how much alcohol is in your drink. In the [place] one drink equals one 12 oz bottle of beer (355 mL), one 5 oz glass of wine (148 mL), or one 1 oz glass of hard liquor (44 mL). Lifestyle Brush your teeth every morning and night with fluoride toothpaste. Floss one time each day. Exercise for at least 30 minutes 5 or more days each week. Do not use any products that contain nicotine or tobacco. These products include cigarettes, chewing tobacco, and vaping devices, such as e-cigarettes. If you need help quitting, ask your health care provider. Do not use drugs. If you are sexually active, practice safe sex. Use a condom or other form of protection to prevent STIs. Take aspirin only as told by your health care provider. Make sure that you understand how much to take and what form to take. Work with your health care provider to find out whether it is safe and beneficial for you to take aspirin daily. Find healthy  ways to manage stress, such as: Meditation, yoga, or listening to music. Journaling. Talking to a trusted person. Spending time with friends and family. Minimize exposure to UV radiation to reduce your risk of skin cancer. Safety Always wear your seat belt while driving or riding in a vehicle. Do not drive: If you have been drinking alcohol. Do not ride with someone who has been drinking. When you are tired or distracted. While texting. If you have been using any mind-altering substances or drugs. Wear a helmet and other protective equipment during sports activities. If you have firearms in your house, make sure you  follow all gun safety procedures. What's next? Go to your health care provider once a year for an annual wellness visit. Ask your health care provider how often you should have your eyes and teeth checked. Stay up to date on all vaccines. This information is not intended to replace advice given to you by your health care provider. Make sure you discuss any questions you have with your health care provider. Document Revised: 08/08/2020 Document Reviewed: 08/08/2020 Elsevier Patient Education  2024 Elsevier Inc.   Managing Your Hypertension Hypertension, also called high blood pressure, is when the force of the blood pressing against the walls of the arteries is too strong. Arteries are blood vessels that carry blood from your heart throughout your body. Hypertension forces the heart to work harder to pump blood and may cause the arteries to become narrow or stiff. Understanding blood pressure readings A blood pressure reading includes a higher number over a lower number: The first, or top, number is called the systolic pressure. It is a measure of the pressure in your arteries as your heart beats. The second, or bottom number, is called the diastolic pressure. It is a measure of the pressure in your arteries as the heart relaxes. For most people, a normal blood pressure is below 120/80. Your personal target blood pressure may vary depending on your medical conditions, your age, and other factors. Blood pressure is classified into four stages. Based on your blood pressure reading, your health care provider may use the following stages to determine what type of treatment you need, if any. Systolic pressure and diastolic pressure are measured in a unit called millimeters of mercury (mmHg). Normal Systolic pressure: below 120. Diastolic pressure: below 80. Elevated Systolic pressure: 120-129. Diastolic pressure: below 80. Hypertension stage 1 Systolic pressure: 130-139. Diastolic pressure:  80-89. Hypertension stage 2 Systolic pressure: 140 or above. Diastolic pressure: 90 or above. How can this condition affect me? Managing your hypertension is very important. Over time, hypertension can damage the arteries and decrease blood flow to parts of the body, including the brain, heart, and kidneys. Having untreated or uncontrolled hypertension can lead to: A heart attack. A stroke. A weakened blood vessel (aneurysm). Heart failure. Kidney damage. Eye damage. Memory and concentration problems. Vascular dementia. What actions can I take to manage this condition? Hypertension can be managed by making lifestyle changes and possibly by taking medicines. Your health care provider will help you make a plan to bring your blood pressure within a normal range. You may be referred for counseling on a healthy diet and physical activity. Nutrition  Eat a diet that is high in fiber and potassium, and low in salt (sodium), added sugar, and fat. An example eating plan is called the DASH diet. DASH stands for Dietary Approaches to Stop Hypertension. To eat this way: Eat plenty of fresh fruits and vegetables. Try to fill  one-half of your plate at each meal with fruits and vegetables. Eat whole grains, such as whole-wheat pasta, brown rice, or whole-grain bread. Fill about one-fourth of your plate with whole grains. Eat low-fat dairy products. Avoid fatty cuts of meat, processed or cured meats, and poultry with skin. Fill about one-fourth of your plate with lean proteins such as fish, chicken without skin, beans, eggs, and tofu. Avoid pre-made and processed foods. These tend to be higher in sodium, added sugar, and fat. Reduce your daily sodium intake. Many people with hypertension should eat less than 1,500 mg of sodium a day. Lifestyle  Work with your health care provider to maintain a healthy body weight or to lose weight. Ask what an ideal weight is for you. Get at least 30 minutes of exercise  that causes your heart to beat faster (aerobic exercise) most days of the week. Activities may include walking, swimming, or biking. Include exercise to strengthen your muscles (resistance exercise), such as weight lifting, as part of your weekly exercise routine. Try to do these types of exercises for 30 minutes at least 3 days a week. Do not use any products that contain nicotine or tobacco. These products include cigarettes, chewing tobacco, and vaping devices, such as e-cigarettes. If you need help quitting, ask your health care provider. Control any long-term (chronic) conditions you have, such as high cholesterol or diabetes. Identify your sources of stress and find ways to manage stress. This may include meditation, deep breathing, or making time for fun activities. Alcohol use Do not drink alcohol if: Your health care provider tells you not to drink. You are pregnant, may be pregnant, or are planning to become pregnant. If you drink alcohol: Limit how much you have to: 0-1 drink a day for women. 0-2 drinks a day for men. Know how much alcohol is in your drink. In the [place] one drink equals one 12 oz bottle of beer (355 mL), one 5 oz glass of wine (148 mL), or one 1 oz glass of hard liquor (44 mL). Medicines Your health care provider may prescribe medicine if lifestyle changes are not enough to get your blood pressure under control and if: Your systolic blood pressure is 130 or higher. Your diastolic blood pressure is 80 or higher. Take medicines only as told by your health care provider. Follow the directions carefully. Blood pressure medicines must be taken as told by your health care provider. The medicine does not work as well when you skip doses. Skipping doses also puts you at risk for problems. Monitoring Before you monitor your blood pressure: Do not smoke, drink caffeinated beverages, or exercise within 30 minutes before taking a measurement. Use the bathroom and empty your  bladder (urinate). Sit quietly for at least 5 minutes before taking measurements. Monitor your blood pressure at home as told by your health care provider. To do this: Sit with your back straight and supported. Place your feet flat on the floor. Do not cross your legs. Support your arm on a flat surface, such as a table. Make sure your upper arm is at heart level. Each time you measure, take two or three readings one minute apart and record the results. You may also need to have your blood pressure checked regularly by your health care provider. General information Talk with your health care provider about your diet, exercise habits, and other lifestyle factors that may be contributing to hypertension. Review all the medicines you take with your health care provider because  there may be side effects or interactions. Keep all follow-up visits. Your health care provider can help you create and adjust your plan for managing your high blood pressure. Where to find more information National Heart, Lung, and Blood Institute: popsteam.is American Heart Association: www.heart.org Contact a health care provider if: You think you are having a reaction to medicines you have taken. You have repeated (recurrent) headaches. You feel dizzy. You have swelling in your ankles. You have trouble with your vision. Get help right away if: You develop a severe headache or confusion. You have unusual weakness or numbness, or you feel faint. You have severe pain in your chest or abdomen. You vomit repeatedly. You have trouble breathing. These symptoms may be an emergency. Get help right away. Call 911. Do not wait to see if the symptoms will go away. Do not drive yourself to the hospital. Summary Hypertension is when the force of blood pumping through your arteries is too strong. If this condition is not controlled, it may put you at risk for serious complications. Your personal target blood pressure  may vary depending on your medical conditions, your age, and other factors. For most people, a normal blood pressure is less than 120/80. Hypertension is managed by lifestyle changes, medicines, or both. Lifestyle changes to help manage hypertension include losing weight, eating a healthy, low-sodium diet, exercising more, stopping smoking, and limiting alcohol. This information is not intended to replace advice given to you by your health care provider. Make sure you discuss any questions you have with your health care provider. Document Revised: 10/25/2020 Document Reviewed: 10/25/2020 Elsevier Patient Education  2024 Elsevier Inc.     Signed,   Reyes Pines, MD [facility], [facility] 03/14/24 11:51 AM      [1] No Known Allergies  "

## 2024-03-14 NOTE — Patient Instructions (Addendum)
 " Thank you for coming in today. No change in medications at this time, but keep an eye on blood pressure - goal of 130/80 or below. If there are any concerns on your bloodwork, I will let you know. Take care!   Lets follow-up with a virtual visit in the next week or 2 and we can review the current vaccines, plans for upcoming travel and discuss Diamox further at that time.  Appreciate you scheduling a separate visit.   Preventive Care 67-55 Years Old, Male Preventive care refers to lifestyle choices and visits with your health care provider that can promote health and wellness. Preventive care visits are also called wellness exams. What can I expect for my preventive care visit? Counseling During your preventive care visit, your health care provider may ask about your: Medical history, including: Past medical problems. Family medical history. Current health, including: Emotional well-being. Home life and relationship well-being. Sexual activity. Lifestyle, including: Alcohol, nicotine or tobacco, and drug use. Access to firearms. Diet, exercise, and sleep habits. Safety issues such as seatbelt and bike helmet use. Sunscreen use. Work and work astronomer. Physical exam Your health care provider will check your: Height and weight. These may be used to calculate your BMI (body mass index). BMI is a measurement that tells if you are at a healthy weight. Waist circumference. This measures the distance around your waistline. This measurement also tells if you are at a healthy weight and may help predict your risk of certain diseases, such as type 2 diabetes and high blood pressure. Heart rate and blood pressure. Body temperature. Skin for abnormal spots. What immunizations do I need?  Vaccines are usually given at various ages, according to a schedule. Your health care provider will recommend vaccines for you based on your age, medical history, and lifestyle or other factors, such as  travel or where you work. What tests do I need? Screening Your health care provider may recommend screening tests for certain conditions. This may include: Lipid and cholesterol levels. Diabetes screening. This is done by checking your blood sugar (glucose) after you have not eaten for a while (fasting). Hepatitis B test. Hepatitis C test. HIV (human immunodeficiency virus) test. STI (sexually transmitted infection) testing, if you are at risk. Lung cancer screening. Prostate cancer screening. Colorectal cancer screening. Talk with your health care provider about your test results, treatment options, and if necessary, the need for more tests. Follow these instructions at home: Eating and drinking  Eat a diet that includes fresh fruits and vegetables, whole grains, lean protein, and low-fat dairy products. Take vitamin and mineral supplements as recommended by your health care provider. Do not drink alcohol if your health care provider tells you not to drink. If you drink alcohol: Limit how much you have to 0-2 drinks a day. Know how much alcohol is in your drink. In the [place] one drink equals one 12 oz bottle of beer (355 mL), one 5 oz glass of wine (148 mL), or one 1 oz glass of hard liquor (44 mL). Lifestyle Brush your teeth every morning and night with fluoride toothpaste. Floss one time each day. Exercise for at least 30 minutes 5 or more days each week. Do not use any products that contain nicotine or tobacco. These products include cigarettes, chewing tobacco, and vaping devices, such as e-cigarettes. If you need help quitting, ask your health care provider. Do not use drugs. If you are sexually active, practice safe sex. Use a condom or other form  of protection to prevent STIs. Take aspirin only as told by your health care provider. Make sure that you understand how much to take and what form to take. Work with your health care provider to find out whether it is safe and  beneficial for you to take aspirin daily. Find healthy ways to manage stress, such as: Meditation, yoga, or listening to music. Journaling. Talking to a trusted person. Spending time with friends and family. Minimize exposure to UV radiation to reduce your risk of skin cancer. Safety Always wear your seat belt while driving or riding in a vehicle. Do not drive: If you have been drinking alcohol. Do not ride with someone who has been drinking. When you are tired or distracted. While texting. If you have been using any mind-altering substances or drugs. Wear a helmet and other protective equipment during sports activities. If you have firearms in your house, make sure you follow all gun safety procedures. What's next? Go to your health care provider once a year for an annual wellness visit. Ask your health care provider how often you should have your eyes and teeth checked. Stay up to date on all vaccines. This information is not intended to replace advice given to you by your health care provider. Make sure you discuss any questions you have with your health care provider. Document Revised: 08/08/2020 Document Reviewed: 08/08/2020 Elsevier Patient Education  2024 [employer] Inc.   Managing Your Hypertension Hypertension, also called high blood pressure, is when the force of the blood pressing against the walls of the arteries is too strong. Arteries are blood vessels that carry blood from your heart throughout your body. Hypertension forces the heart to work harder to pump blood and may cause the arteries to become narrow or stiff. Understanding blood pressure readings A blood pressure reading includes a higher number over a lower number: The first, or top, number is called the systolic pressure. It is a measure of the pressure in your arteries as your heart beats. The second, or bottom number, is called the diastolic pressure. It is a measure of the pressure in your arteries as the heart  relaxes. For most people, a normal blood pressure is below 120/80. Your personal target blood pressure may vary depending on your medical conditions, your age, and other factors. Blood pressure is classified into four stages. Based on your blood pressure reading, your health care provider may use the following stages to determine what type of treatment you need, if any. Systolic pressure and diastolic pressure are measured in a unit called millimeters of mercury (mmHg). Normal Systolic pressure: below 120. Diastolic pressure: below 80. Elevated Systolic pressure: 120-129. Diastolic pressure: below 80. Hypertension stage 1 Systolic pressure: 130-139. Diastolic pressure: 80-89. Hypertension stage 2 Systolic pressure: 140 or above. Diastolic pressure: 90 or above. How can this condition affect me? Managing your hypertension is very important. Over time, hypertension can damage the arteries and decrease blood flow to parts of the body, including the brain, heart, and kidneys. Having untreated or uncontrolled hypertension can lead to: A heart attack. A stroke. A weakened blood vessel (aneurysm). Heart failure. Kidney damage. Eye damage. Memory and concentration problems. Vascular dementia. What actions can I take to manage this condition? Hypertension can be managed by making lifestyle changes and possibly by taking medicines. Your health care provider will help you make a plan to bring your blood pressure within a normal range. You may be referred for counseling on a healthy diet and physical activity.  Nutrition  Eat a diet that is high in fiber and potassium, and low in salt (sodium), added sugar, and fat. An example eating plan is called the DASH diet. DASH stands for Dietary Approaches to Stop Hypertension. To eat this way: Eat plenty of fresh fruits and vegetables. Try to fill one-half of your plate at each meal with fruits and vegetables. Eat whole grains, such as whole-wheat pasta,  brown rice, or whole-grain bread. Fill about one-fourth of your plate with whole grains. Eat low-fat dairy products. Avoid fatty cuts of meat, processed or cured meats, and poultry with skin. Fill about one-fourth of your plate with lean proteins such as fish, chicken without skin, beans, eggs, and tofu. Avoid pre-made and processed foods. These tend to be higher in sodium, added sugar, and fat. Reduce your daily sodium intake. Many people with hypertension should eat less than 1,500 mg of sodium a day. Lifestyle  Work with your health care provider to maintain a healthy body weight or to lose weight. Ask what an ideal weight is for you. Get at least 30 minutes of exercise that causes your heart to beat faster (aerobic exercise) most days of the week. Activities may include walking, swimming, or biking. Include exercise to strengthen your muscles (resistance exercise), such as weight lifting, as part of your weekly exercise routine. Try to do these types of exercises for 30 minutes at least 3 days a week. Do not use any products that contain nicotine or tobacco. These products include cigarettes, chewing tobacco, and vaping devices, such as e-cigarettes. If you need help quitting, ask your health care provider. Control any long-term (chronic) conditions you have, such as high cholesterol or diabetes. Identify your sources of stress and find ways to manage stress. This may include meditation, deep breathing, or making time for fun activities. Alcohol use Do not drink alcohol if: Your health care provider tells you not to drink. You are pregnant, may be pregnant, or are planning to become pregnant. If you drink alcohol: Limit how much you have to: 0-1 drink a day for women. 0-2 drinks a day for men. Know how much alcohol is in your drink. In the [place] one drink equals one 12 oz bottle of beer (355 mL), one 5 oz glass of wine (148 mL), or one 1 oz glass of hard liquor (44 mL). Medicines Your  health care provider may prescribe medicine if lifestyle changes are not enough to get your blood pressure under control and if: Your systolic blood pressure is 130 or higher. Your diastolic blood pressure is 80 or higher. Take medicines only as told by your health care provider. Follow the directions carefully. Blood pressure medicines must be taken as told by your health care provider. The medicine does not work as well when you skip doses. Skipping doses also puts you at risk for problems. Monitoring Before you monitor your blood pressure: Do not smoke, drink caffeinated beverages, or exercise within 30 minutes before taking a measurement. Use the bathroom and empty your bladder (urinate). Sit quietly for at least 5 minutes before taking measurements. Monitor your blood pressure at home as told by your health care provider. To do this: Sit with your back straight and supported. Place your feet flat on the floor. Do not cross your legs. Support your arm on a flat surface, such as a table. Make sure your upper arm is at heart level. Each time you measure, take two or three readings one minute apart and record the  results. You may also need to have your blood pressure checked regularly by your health care provider. General information Talk with your health care provider about your diet, exercise habits, and other lifestyle factors that may be contributing to hypertension. Review all the medicines you take with your health care provider because there may be side effects or interactions. Keep all follow-up visits. Your health care provider can help you create and adjust your plan for managing your high blood pressure. Where to find more information National Heart, Lung, and Blood Institute: popsteam.is American Heart Association: www.heart.org Contact a health care provider if: You think you are having a reaction to medicines you have taken. You have repeated (recurrent) headaches. You  feel dizzy. You have swelling in your ankles. You have trouble with your vision. Get help right away if: You develop a severe headache or confusion. You have unusual weakness or numbness, or you feel faint. You have severe pain in your chest or abdomen. You vomit repeatedly. You have trouble breathing. These symptoms may be an emergency. Get help right away. Call 911. Do not wait to see if the symptoms will go away. Do not drive yourself to the hospital. Summary Hypertension is when the force of blood pumping through your arteries is too strong. If this condition is not controlled, it may put you at risk for serious complications. Your personal target blood pressure may vary depending on your medical conditions, your age, and other factors. For most people, a normal blood pressure is less than 120/80. Hypertension is managed by lifestyle changes, medicines, or both. Lifestyle changes to help manage hypertension include losing weight, eating a healthy, low-sodium diet, exercising more, stopping smoking, and limiting alcohol. This information is not intended to replace advice given to you by your health care provider. Make sure you discuss any questions you have with your health care provider. Document Revised: 10/25/2020 Document Reviewed: 10/25/2020 Elsevier Patient Education  2024 [employer].  "

## 2024-03-15 ENCOUNTER — Encounter: Payer: Self-pay | Admitting: Family Medicine

## 2024-03-18 ENCOUNTER — Ambulatory Visit: Payer: Self-pay | Admitting: Family Medicine

## 2024-03-18 DIAGNOSIS — E875 Hyperkalemia: Secondary | ICD-10-CM

## 2024-03-23 ENCOUNTER — Telehealth: Admitting: Family Medicine

## 2024-03-23 ENCOUNTER — Encounter: Payer: Self-pay | Admitting: Family Medicine

## 2024-03-23 DIAGNOSIS — Z0184 Encounter for antibody response examination: Secondary | ICD-10-CM

## 2024-03-23 DIAGNOSIS — Z2989 Encounter for other specified prophylactic measures: Secondary | ICD-10-CM | POA: Diagnosis not present

## 2024-03-23 DIAGNOSIS — Z23 Encounter for immunization: Secondary | ICD-10-CM

## 2024-03-23 DIAGNOSIS — Z7184 Encounter for health counseling related to travel: Secondary | ICD-10-CM

## 2024-03-23 MED ORDER — DOXYCYCLINE HYCLATE 100 MG PO TABS: 100.0000 mg | ORAL_TABLET | Freq: Every day | ORAL | 0 refills | Status: AC

## 2024-03-23 MED ORDER — ACETAZOLAMIDE 125 MG PO TABS: 125.0000 mg | ORAL_TABLET | Freq: Two times a day (BID) | ORAL | 0 refills | Status: AC

## 2024-03-23 MED ORDER — VIVOTIF PO CPDR: 1.0000 | DELAYED_RELEASE_CAPSULE | ORAL | 0 refills | Status: AC

## 2024-03-23 NOTE — Progress Notes (Unsigned)
 Virtual Visit via Video Note  I connected with Zachary Diaz on 03/23/24 at 8:28 AM by a video enabled telemedicine application and verified that I am speaking with the correct person using two identifiers.  Patient location: home, by self.  My location: office - [place].    I discussed the limitations, risks, security and privacy concerns of performing an evaluation and management service by telephone and the availability of in person appointments. I also discussed with the patient that there may be a patient responsible charge related to this service. The patient expressed understanding and agreed to proceed, consent obtained  Chief complaint:  Chief Complaint  Patient presents with   travel consultation    Patient wants to know what vaccines he should get and high altitude. Taking a trip in March international.     History of Present Illness: Zachary Diaz is a 54 y.o. male  Travel advice encounter as well as discussion of prevention of altitude sickness Briefly discussed at his last visit.  He will be traveling to [place] on March 6,  arrive on March 8th, will be hiking [place] - summit is just below 20k feet. planned 8 days initially to ascend with planned acclimatization - taking a few days longer than typical to acclimate, then planned ascend/descend in 24 hours.  Has considered use of Diamox  to lessen risk of altitude illness.  He has not had previous altitude sickness/illness, no history of HACE/HAPE. Has medical support in place, and will have ongoing monitoring for symptoms, study.   Highest altitude 14k feet for skiing - no acclimatizing, slight HA, difficult with sleep initially only.  No history of sulfa allergy.  In [place] for 11 days - malaria prophylaxis planned. Has tolerated doxycycline .  CDC travel recommendations reviewed.  Immunization History  Administered Date(s) Administered   Influenza Inj Mdck Quad Pf 12/28/2017   Influenza Split  12/23/2011, 12/14/2012, 11/23/2014   Influenza, Seasonal, Injecte, Preservative Fre 03/14/2024   Influenza,inj,Quad PF,6+ Mos 12/01/2016, 11/26/2018   Influenza-Unspecified 11/28/2013, 11/14/2015, 12/04/2018, 01/15/2020, 01/15/2020   PFIZER(Purple Top)SARS-COV-2 Vaccination 05/09/2019, 05/30/2019, 01/15/2020   PNEUMOCOCCAL CONJUGATE-20 03/14/2024   Pneumococcal Polysaccharide-23 11/25/2010   Tdap 02/25/2008, 03/30/2018   Zoster Recombinant(Shingrix ) 08/16/2020, 08/21/2021  Covid booster - recommended with travel. At pharmacy.  UTD on routine vaccinations.  Unknown MMR status. Not area of cholera transmission to his knowledge, bottled water planned.  Hepatitis A vaccine -has not had.  Has not had hep B vaccine that he knows of. Request both vaccines. Typhoid vaccination discussed.      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 Authorizing Provider  finasteride  (PROSCAR ) 5 MG tablet TAKE 1/4 TABLET DAILY 03/14/24  Yes Levora Reyes SAUNDERS, MD  lisinopril  (ZESTRIL ) 5 MG tablet Take 1 tablet (5 mg total) by mouth daily. 03/14/24  Yes Levora Reyes SAUNDERS, MD  Multiple Vitamin (MULTIVITAMIN) tablet Take 1 tablet by mouth daily.   Yes [provider]  tadalafil (CIALIS) 5 MG tablet Take 5 mg by  mouth daily as needed for erectile dysfunction.   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[employer], lawyer  Tobacco Use   Smoking status: Never   Smokeless tobacco: Never  Vaping Use   Vaping status: Never Used  Substance and Sexual Activity   Alcohol use: Yes    Alcohol/week: 5.0 standard drinks of alcohol    Types: 5 Standard drinks or equivalent per week    Comment: 5-7 weekly average   Drug use: No    Comment: cbd oil for sleep   Sexual activity: Yes  Other Topics Concern   Not on file  Social History Narrative   Married, 5 children   1 son, autism   1 daughter   Runs   Education: College   Exercise: Yes   Caffeine- 1 am and 1 pm tea   Realtor Attorney    Social Drivers of Health   Tobacco Use: Low Risk (03/23/2024)   Patient History    Smoking Tobacco Use: Never    Smokeless Tobacco Use: Never    Passive Exposure: Not on file  Financial Resource Strain: Not on file  Food Insecurity: Not on file  Transportation Needs: Not on file  Physical Activity: Not on file  Stress: Not on file  Social Connections: Not on file  Intimate Partner Violence: Not on file  Depression (PHQ2-9): Low Risk (03/14/2024)   Depression (PHQ2-9)    PHQ-2 Score: 0  Alcohol Screen: Not on file  Housing: Not on file  Utilities: Not on file  Health Literacy: Not on file    Observations/Objective: There were no vitals filed for this visit., Nontoxic appearance on video, normal respiratory effort, no distress, euthymic mood.  All questions answered with understanding of plan expressed  Assessment and Plan: Travel advice encounter - Plan: acetaZOLAMIDE  (DIAMOX ) 125 MG tablet, Hepatitis A vaccine adult IM, Heplisav-B (HepB-CPG) Vaccine, doxycycline  (VIBRA -TABS) 100 MG tablet, Measles/Mumps/Rubella Immunity, typhoid (VIVOTIF ) DR capsule  Immunity status testing  Need for hepatitis A immunization - Plan: Hepatitis A vaccine adult IM  Need for hepatitis B vaccination  - Plan: Heplisav-B (HepB-CPG) Vaccine  Need for immunization against typhoid - Plan: typhoid (VIVOTIF ) DR capsule  Need for malaria prophylaxis - Plan: doxycycline  (VIBRA -TABS) 100 MG tablet  Guidance given regarding outcome of travel including malaria prophylaxis, typhoid vaccination, and first doses of hep A, hep B vaccination.  Discussed that he will not have complete immunity but some benefit to at least 1 dose.  Follow-up intervals discussed.  Will check MMR titer for measles immunity. In regards to high-altitude illness prevention, he does plan to acclimate at lower levels, slowly as send initially before ascending and descending in a goal of 24 hours.  He does plan on starting on Diamox  125 mg twice daily for prevention.  Potential side effects were discussed.  We discussed possible signs and symptoms of high-altitude illness and if he has any mild symptoms on that medication he can double to 250 mg twice daily.  If not quickly improving or any progression of symptoms will need to be evaluated by medical provider.  We briefly discussed risk of HACE and HAPE and that additional treatments would be needed including possible dexamethasone or  nifedipine if those conditions would occur along with close monitoring by medical provider.  Understanding expressed and he will have medical support available during his hike.   Follow Up Instructions: Nurse/lab visit in the next 1 week   I discussed the assessment and treatment plan with the patient. The patient was provided an opportunity to ask questions and all were answered. The patient agreed with the plan and demonstrated an understanding of the instructions.   The patient was advised to call back or seek an in-person evaluation if the symptoms worsen or if the condition fails to improve as anticipated.   Reyes JONELLE Pines, MD     [1] No Known Allergies

## 2024-03-23 NOTE — Patient Instructions (Addendum)
 You can try the acetazolamide  a few times prior to travel to determine side effects.  Plan for start 125mg  twice per day on day before ascent and continuing 3-4 days at altitude. If you have symptoms of high-altitude illness while taking this medication, increase to 250mg  twice per day and consult with medical provider, as based on those symptoms may need additional treatment. Hepatitis A vaccine and B vaccines at nurse visit. Repeat hep A in 6 moths, hep B in 1-2 months.  Doxycycline  once per day, continue 4 weeks on return.  I will check MMR immunity at nurse/lab visit.  Vivotif  for typhoid prevention - take prior to using doxycycline .   Other recommendations per CDC travel website. We will call you for a lab visit, nurse visit.  Have a safe trip and let me know if there are questions prior to that time.

## 2024-03-23 NOTE — Progress Notes (Signed)
 LM to call back, patient needs lab visit for MMR titer and Hep A and B vaccines.

## 2024-03-24 NOTE — Progress Notes (Signed)
 Patient has now been scheduled

## 2024-03-28 ENCOUNTER — Encounter: Payer: Self-pay | Admitting: *Deleted

## 2024-03-29 ENCOUNTER — Other Ambulatory Visit

## 2024-03-31 ENCOUNTER — Other Ambulatory Visit

## 2024-03-31 DIAGNOSIS — Z7184 Encounter for health counseling related to travel: Secondary | ICD-10-CM

## 2024-03-31 DIAGNOSIS — Z23 Encounter for immunization: Secondary | ICD-10-CM

## 2024-03-31 NOTE — Progress Notes (Signed)
 Hep A and B vaccine recommended following last visit.

## 2024-03-31 NOTE — Progress Notes (Signed)
 Patient is in office today for a nurse visit for Immunization. Patient Injection was given in the  Left deltoid. Patient tolerated injection well.  Hep A was given in the left deltoid IM Hep B was given in the right deltoid IM

## 2024-03-31 NOTE — Addendum Note (Signed)
 Addended by: HONOR BERN A on: 03/31/2024 03:13 PM   Modules accepted: Level of Service

## 2024-04-01 LAB — MEASLES/MUMPS/RUBELLA IMMUNITY
Mumps IgG: 112 [AU]/ml
Rubella: 3.11 {index}
Rubeola IgG: 33.4 [AU]/ml
